# Patient Record
Sex: Female | Born: 1975
Health system: Southern US, Community
[De-identification: ages and names within clinical notes are randomized; demographics above are authoritative.]

## PROBLEM LIST (undated history)

## (undated) DIAGNOSIS — D649 Anemia, unspecified: Secondary | ICD-10-CM

## (undated) DIAGNOSIS — T7840XA Allergy, unspecified, initial encounter: Secondary | ICD-10-CM

## (undated) DIAGNOSIS — E669 Obesity, unspecified: Secondary | ICD-10-CM

## (undated) DIAGNOSIS — M5126 Other intervertebral disc displacement, lumbar region: Secondary | ICD-10-CM

## (undated) DIAGNOSIS — G4730 Sleep apnea, unspecified: Secondary | ICD-10-CM

## (undated) HISTORY — DX: Allergy, unspecified, initial encounter: T78.40XA

## (undated) HISTORY — DX: Other intervertebral disc displacement, lumbar region: M51.26

## (undated) HISTORY — PX: TONSILLECTOMY: SUR1361

## (undated) HISTORY — PX: HERNIA REPAIR: SHX51

---

## 1999-02-24 ENCOUNTER — Other Ambulatory Visit: Admission: RE | Admit: 1999-02-24 | Discharge: 1999-02-24 | Payer: Self-pay | Admitting: Gynecology

## 1999-07-01 ENCOUNTER — Other Ambulatory Visit: Admission: RE | Admit: 1999-07-01 | Discharge: 1999-07-01 | Payer: Self-pay | Admitting: Obstetrics & Gynecology

## 2000-06-29 ENCOUNTER — Other Ambulatory Visit: Admission: RE | Admit: 2000-06-29 | Discharge: 2000-06-29 | Payer: Self-pay | Admitting: Obstetrics & Gynecology

## 2001-07-19 ENCOUNTER — Other Ambulatory Visit: Admission: RE | Admit: 2001-07-19 | Discharge: 2001-07-19 | Payer: Self-pay | Admitting: Obstetrics & Gynecology

## 2002-08-02 ENCOUNTER — Other Ambulatory Visit: Admission: RE | Admit: 2002-08-02 | Discharge: 2002-08-02 | Payer: Self-pay | Admitting: Obstetrics & Gynecology

## 2002-12-14 ENCOUNTER — Emergency Department (HOSPITAL_COMMUNITY): Admission: EM | Admit: 2002-12-14 | Discharge: 2002-12-14 | Payer: Self-pay | Admitting: Emergency Medicine

## 2002-12-14 ENCOUNTER — Encounter: Payer: Self-pay | Admitting: Emergency Medicine

## 2003-08-09 ENCOUNTER — Inpatient Hospital Stay (HOSPITAL_COMMUNITY): Admission: AD | Admit: 2003-08-09 | Discharge: 2003-08-13 | Payer: Self-pay | Admitting: Obstetrics & Gynecology

## 2003-08-14 ENCOUNTER — Encounter: Admission: RE | Admit: 2003-08-14 | Discharge: 2003-09-13 | Payer: Self-pay | Admitting: Obstetrics and Gynecology

## 2003-09-25 ENCOUNTER — Other Ambulatory Visit: Admission: RE | Admit: 2003-09-25 | Discharge: 2003-09-25 | Payer: Self-pay | Admitting: Obstetrics and Gynecology

## 2004-10-01 ENCOUNTER — Other Ambulatory Visit: Admission: RE | Admit: 2004-10-01 | Discharge: 2004-10-01 | Payer: Self-pay | Admitting: Obstetrics & Gynecology

## 2005-10-06 ENCOUNTER — Other Ambulatory Visit: Admission: RE | Admit: 2005-10-06 | Discharge: 2005-10-06 | Payer: Self-pay | Admitting: Obstetrics & Gynecology

## 2008-12-21 HISTORY — PX: WISDOM TOOTH EXTRACTION: SHX21

## 2011-09-17 ENCOUNTER — Other Ambulatory Visit: Payer: Self-pay | Admitting: Physical Medicine and Rehabilitation

## 2011-09-17 DIAGNOSIS — M545 Low back pain, unspecified: Secondary | ICD-10-CM

## 2011-09-18 ENCOUNTER — Ambulatory Visit
Admission: RE | Admit: 2011-09-18 | Discharge: 2011-09-18 | Disposition: A | Discharge status: Home / Self Care | Payer: BC Managed Care – PPO | Source: Ambulatory Visit | Attending: Physical Medicine and Rehabilitation | Admitting: Physical Medicine and Rehabilitation

## 2011-09-18 DIAGNOSIS — M545 Low back pain, unspecified: Secondary | ICD-10-CM

## 2011-09-18 MED ORDER — IOHEXOL 180 MG/ML  SOLN
1.0000 mL | Freq: Once | INTRAMUSCULAR | Status: AC | PRN
  Administered 2011-09-18: 1 mL via EPIDURAL

## 2011-09-18 MED ORDER — METHYLPREDNISOLONE ACETATE 40 MG/ML INJ SUSP (RADIOLOG
120.0000 mg | Freq: Once | INTRAMUSCULAR | Status: AC
  Administered 2011-09-18: 120 mg via EPIDURAL

## 2015-07-16 ENCOUNTER — Other Ambulatory Visit: Payer: Self-pay | Admitting: Specialist

## 2015-07-22 ENCOUNTER — Other Ambulatory Visit: Payer: Self-pay | Admitting: Specialist

## 2015-08-06 ENCOUNTER — Ambulatory Visit
Admission: RE | Admit: 2015-08-06 | Discharge: 2015-08-06 | Disposition: A | Discharge status: Home / Self Care | Payer: BLUE CROSS/BLUE SHIELD | Source: Ambulatory Visit | Attending: Specialist | Admitting: Specialist

## 2015-11-21 HISTORY — PX: BARIATRIC SURGERY: SHX1103

## 2015-12-03 ENCOUNTER — Other Ambulatory Visit: Payer: Self-pay

## 2015-12-03 ENCOUNTER — Encounter
Admission: RE | Admit: 2015-12-03 | Discharge: 2015-12-03 | Disposition: A | Discharge status: Home / Self Care | Payer: BLUE CROSS/BLUE SHIELD | Source: Ambulatory Visit | Attending: Specialist | Admitting: Specialist

## 2015-12-03 DIAGNOSIS — Z01818 Encounter for other preprocedural examination: Secondary | ICD-10-CM | POA: Insufficient documentation

## 2015-12-03 HISTORY — DX: Anemia, unspecified: D64.9

## 2015-12-03 HISTORY — DX: Obesity, unspecified: E66.9

## 2015-12-03 HISTORY — DX: Sleep apnea, unspecified: G47.30

## 2015-12-03 LAB — TYPE AND SCREEN
ABO/RH(D): AB POS
Antibody Screen: NEGATIVE

## 2015-12-03 NOTE — Patient Instructions (Signed)
  Your procedure is scheduled on: December 10, 2015 (Tuesday) Report to Day Surgery. To find out your arrival time please call [phone] between 1PM - 3PM on December 09, 2015 (Monday).  Remember: Instructions that are not followed completely may result in serious medical risk, up to and including death, or upon the discretion of your surgeon and anesthesiologist your surgery may need to be rescheduled.    __x_ 1. Do not eat food or drink liquids after midnight. No gum chewing or hard candies.     __x__ 2. No Alcohol for 24 hours before or after surgery.   ____ 3. Bring all medications with you on the day of surgery if instructed.    __x__ 4. Notify your doctor if there is any change in your medical condition     (cold, fever, infections).     Do not wear jewelry, make-up, hairpins, clips or nail polish.  Do not wear lotions, powders, or perfumes. You may wear deodorant.  Do not shave 48 hours prior to surgery. Men may shave face and neck.  Do not bring valuables to the hospital.    [facility] is not responsible for any belongings or valuables.               Contacts, dentures or bridgework may not be worn into surgery.  Leave your suitcase in the car. After surgery it may be brought to your room.  For patients admitted to the hospital, discharge time is determined by your                treatment team.   Patients discharged the day of surgery will not be allowed to drive home.   Please read over the following fact sheets that you were given:   Surgical Site Infection Prevention   ____ Take these medicines the morning of surgery with A SIP OF WATER:    1.   2.   3.   4.  5.  6.  ____ Fleet Enema (as directed)   __x__ Use CHG Soap as directed  ____ Use inhalers on the day of surgery  ____ Stop metformin 2 days prior to surgery    ____ Take 1/2 of usual insulin dose the night before surgery and none on the morning of surgery.   ____ Stop Coumadin/Plavix/aspirin on    ____ Stop Anti-inflammatories on    ____ Stop supplements until after surgery.    __x__ Bring C-Pap to the hospital.

## 2015-12-04 LAB — ABO/RH: ABO/RH(D): AB POS

## 2015-12-04 NOTE — Pre-Procedure Instructions (Addendum)
Obtained pre-op order sheet and patient chart no orders checked to be done on form.  Office answering service picked up phone unable to help will placed in needs box.

## 2015-12-10 ENCOUNTER — Encounter: Admission: RE | Payer: Self-pay | Source: Ambulatory Visit

## 2015-12-10 ENCOUNTER — Inpatient Hospital Stay: Admission: RE | Admit: 2015-12-10 | Payer: BLUE CROSS/BLUE SHIELD | Source: Ambulatory Visit | Admitting: Specialist

## 2015-12-10 SURGERY — LAPAROSCOPIC GASTRIC RESTRICTIVE DUODENAL PROCEDURE (DUODENAL SWITCH)
Anesthesia: Choice

## 2016-04-29 DIAGNOSIS — K912 Postsurgical malabsorption, not elsewhere classified: Secondary | ICD-10-CM | POA: Diagnosis not present

## 2016-04-29 DIAGNOSIS — Z9884 Bariatric surgery status: Secondary | ICD-10-CM | POA: Diagnosis not present

## 2016-06-02 DIAGNOSIS — K912 Postsurgical malabsorption, not elsewhere classified: Secondary | ICD-10-CM | POA: Diagnosis not present

## 2016-06-02 DIAGNOSIS — Z9884 Bariatric surgery status: Secondary | ICD-10-CM | POA: Diagnosis not present

## 2016-06-24 DIAGNOSIS — M5431 Sciatica, right side: Secondary | ICD-10-CM | POA: Diagnosis not present

## 2016-06-26 ENCOUNTER — Other Ambulatory Visit: Payer: Self-pay | Admitting: Physical Medicine and Rehabilitation

## 2016-06-26 DIAGNOSIS — M5136 Other intervertebral disc degeneration, lumbar region: Secondary | ICD-10-CM

## 2016-06-27 DIAGNOSIS — H5213 Myopia, bilateral: Secondary | ICD-10-CM | POA: Diagnosis not present

## 2016-07-01 ENCOUNTER — Ambulatory Visit
Admission: RE | Admit: 2016-07-01 | Discharge: 2016-07-01 | Disposition: A | Discharge status: Home / Self Care | Payer: BLUE CROSS/BLUE SHIELD | Source: Ambulatory Visit | Attending: Physical Medicine and Rehabilitation | Admitting: Physical Medicine and Rehabilitation

## 2016-07-01 DIAGNOSIS — M5136 Other intervertebral disc degeneration, lumbar region: Secondary | ICD-10-CM

## 2016-07-01 DIAGNOSIS — M47817 Spondylosis without myelopathy or radiculopathy, lumbosacral region: Secondary | ICD-10-CM | POA: Diagnosis not present

## 2016-07-01 MED ORDER — METHYLPREDNISOLONE ACETATE 40 MG/ML INJ SUSP (RADIOLOG
120.0000 mg | Freq: Once | INTRAMUSCULAR | Status: AC
  Administered 2016-07-01: 120 mg via EPIDURAL

## 2016-07-01 MED ORDER — IOPAMIDOL (ISOVUE-M 200) INJECTION 41%
1.0000 mL | Freq: Once | INTRAMUSCULAR | Status: AC
  Administered 2016-07-01: 1 mL via EPIDURAL

## 2016-07-01 NOTE — Discharge Instructions (Signed)

## 2016-08-23 ENCOUNTER — Emergency Department (HOSPITAL_COMMUNITY)
Admission: EM | Admit: 2016-08-23 | Discharge: 2016-08-23 | Disposition: A | Discharge status: Home / Self Care | Payer: BLUE CROSS/BLUE SHIELD | Attending: Emergency Medicine | Admitting: Emergency Medicine

## 2016-08-23 ENCOUNTER — Encounter (HOSPITAL_COMMUNITY): Payer: Self-pay | Admitting: Emergency Medicine

## 2016-08-23 DIAGNOSIS — R200 Anesthesia of skin: Secondary | ICD-10-CM | POA: Insufficient documentation

## 2016-08-23 DIAGNOSIS — Z79899 Other long term (current) drug therapy: Secondary | ICD-10-CM | POA: Insufficient documentation

## 2016-08-23 DIAGNOSIS — M79604 Pain in right leg: Secondary | ICD-10-CM | POA: Diagnosis not present

## 2016-08-23 DIAGNOSIS — R202 Paresthesia of skin: Secondary | ICD-10-CM | POA: Insufficient documentation

## 2016-08-23 LAB — URINALYSIS, ROUTINE W REFLEX MICROSCOPIC
BILIRUBIN URINE: NEGATIVE
GLUCOSE, UA: NEGATIVE mg/dL
Hgb urine dipstick: NEGATIVE
Ketones, ur: 15 mg/dL — AB
Leukocytes, UA: NEGATIVE
NITRITE: NEGATIVE
PH: 7.5 (ref 5.0–8.0)
Protein, ur: NEGATIVE mg/dL
SPECIFIC GRAVITY, URINE: 1.019 (ref 1.005–1.030)

## 2016-08-23 LAB — PREGNANCY, URINE: Preg Test, Ur: NEGATIVE

## 2016-08-23 MED ORDER — KETOROLAC TROMETHAMINE 30 MG/ML IJ SOLN
30.0000 mg | Freq: Once | INTRAMUSCULAR | Status: AC
  Administered 2016-08-23: 30 mg via INTRAMUSCULAR
  Filled 2016-08-23: qty 1

## 2016-08-23 MED ORDER — KETOROLAC TROMETHAMINE 30 MG/ML IJ SOLN: 30.0000 mg | Freq: Once | INTRAMUSCULAR | Status: DC

## 2016-08-23 MED ORDER — OXYCODONE-ACETAMINOPHEN 5-325 MG PO TABS
1.0000 | ORAL_TABLET | Freq: Once | ORAL | Status: AC
  Administered 2016-08-23: 1 via ORAL

## 2016-08-23 MED ORDER — OXYCODONE-ACETAMINOPHEN 5-325 MG PO TABS
1.0000 | ORAL_TABLET | ORAL | Status: DC | PRN
  Administered 2016-08-23: 1 via ORAL
  Filled 2016-08-23 (×2): qty 1

## 2016-08-23 NOTE — Discharge Instructions (Signed)
Continue to take your prescribed medications at home for back pain. Get plenty of rest. Activity as tolerated. Follow up with your orthopaedist on Tuesday. If your leg starts swelling, gets red, becomes extremely painful, or if you develop chest pain or shortness of breath return to the ED.

## 2016-08-23 NOTE — ED Provider Notes (Signed)
[facility]-EMERGENCY DEPT Provider Note   CSN: [id number] Arrival date & time: 08/23/16  1734     History   Chief Complaint Chief Complaint  Patient presents with  . Leg Pain    HPI Alisha Brock is a 40 y.o. female.  40 year old African-American female with history of lumbar herniated disc that presents to the ED this evening with right leg pain. Patient states pain started approximately 2 weeks ago when she went to get out of the bed to go the bathroom. The pain starts at her right lower back and radiates down her entire right leg to her right calf. Describes the pain as an aching that is constant. Endorses right leg numbness and tinling. Denies back pain at this time. Moving makes pain worse. She has tried meloxicam and hydrocodone with little relief. Lying on her left side helps ease the pain. Able to walk. She has been exercising which seems to help the pain acutely. Denies any recent trauma. Patient epidural steroid injection in July 2017. Patient states she is worried about a blood clot. Patient returned from [place] August 16. Leg pain started approximately one week after returning. Patient endorses using oral contraceptives. Denies unilateral leg swelling, recent hospitalizations/surgeries, tobacco use, history of DVT. Patient states that her father has history of blood clots. Denies any fever, chills, vision changes, headache, chest pain, shortness of breath, abdominal pain, nausea, vomiting, loss of bowel or bladder, urinary retention, saddle paresthesia.       Past Medical History:  Diagnosis Date  . Anemia   . Obesity   . Sleep apnea     There are no active problems to display for this patient.   Past Surgical History:  Procedure Laterality Date  . CESAREAN SECTION  August 2004  . HERNIA REPAIR Bilateral    Inguinal Hernia Repair at age 79.  . TONSILLECTOMY      OB History    No data available       Home Medications    Prior to Admission medications     Medication Sig Start Date End Date Taking? Authorizing Provider  BIOTIN PO Take 1 tablet by mouth daily.   Yes Historical Provider, MD  Cholecalciferol (VITAMIN D3) 10000 units capsule Take 10,000 Units by mouth daily.   Yes Historical Provider, MD  [facility] Liver Oil CAPS Take 1 capsule by mouth daily.   Yes Historical Provider, MD  Fe Bisgly-Vit C-Vit B12-FA (GENTLE IRON PO) Take 1 tablet by mouth daily.   Yes Historical Provider, MD  fexofenadine (ALLEGRA) 180 MG tablet Take 180 mg by mouth daily.   Yes Historical Provider, MD  HYDROcodone-acetaminophen (HYCET) 7.5-325 mg/15 ml solution Take 15 mLs by mouth 2 (two) times daily as needed for moderate pain.   Yes Historical Provider, MD  ketotifen (ALAWAY) 0.025 % ophthalmic solution Place 1 drop into both eyes daily.   Yes Historical Provider, MD  levonorgestrel-ethinyl estradiol (SEASONALE,INTROVALE,JOLESSA) 0.15-0.03 MG tablet Take 1 tablet by mouth daily. 07/19/16  Yes Historical Provider, MD  meloxicam (MOBIC) 15 MG tablet Take 15 mg by mouth daily as needed for pain.  06/25/16  Yes Historical Provider, MD  Multiple Vitamin (MULTIVITAMIN) tablet Take 1 tablet by mouth daily.   Yes Historical Provider, MD  nystatin ointment (MYCOSTATIN) Apply 1 application topically daily as needed (rash).  06/03/16  Yes Historical Provider, MD  OVER THE COUNTER MEDICATION Take 1 tablet by mouth 2 (two) times daily. "super cleanse"   Yes Historical Provider, MD  triamcinolone ointment (KENALOG)  0.1 % Apply 1 application topically daily as needed (rash).  06/03/16  Yes Historical Provider, MD    Family History Family History  Problem Relation Age of Onset  . Hypertension Mother   . Hypercholesterolemia Father     Social History Social History  Substance Use Topics  . Smoking status: Never Smoker  . Smokeless tobacco: Never Used  . Alcohol use Yes     Comment: occ     Allergies   Review of patient's allergies indicates no known allergies.   Review of  Systems Review of Systems  Constitutional: Negative for chills and fever.  HENT: Negative for congestion, ear pain and sore throat.   Eyes: Negative for pain and visual disturbance.  Respiratory: Negative for cough and shortness of breath.   Cardiovascular: Negative for chest pain, palpitations and leg swelling.  Gastrointestinal: Negative for abdominal pain, constipation, diarrhea, nausea and vomiting.  Genitourinary: Negative for dysuria, flank pain, frequency, hematuria and urgency.  Musculoskeletal: Negative for arthralgias and back pain.  Skin: Negative for color change and rash.  Neurological: Negative for dizziness, syncope, weakness, light-headedness, numbness and headaches.  All other systems reviewed and are negative.    Physical Exam Updated Vital Signs BP 123/83 (BP Location: Right Arm)   Pulse 82   Temp 99.3 F (37.4 C) (Oral)   Resp 18   SpO2 100%   Physical Exam  Constitutional: She appears well-developed and well-nourished. No distress.  HENT:  Head: Normocephalic and atraumatic.  Mouth/Throat: Oropharynx is clear and moist.  Eyes: Conjunctivae are normal. Pupils are equal, round, and reactive to light. Right eye exhibits no discharge. Left eye exhibits no discharge. No scleral icterus.  Neck: Normal range of motion. Neck supple. No thyromegaly present.  Cardiovascular: Normal rate, regular rhythm, normal heart sounds and intact distal pulses.   Pulmonary/Chest: Effort normal and breath sounds normal. No respiratory distress.  Abdominal: Soft. Bowel sounds are normal. She exhibits no distension and no mass. There is no tenderness. There is no rebound.  Musculoskeletal: Normal range of motion.  Positive straight leg raise of right leg approx 30 degrees, with pain, numbness, and tingling sensation down her right leg. Full ROM of right hip and knee. No calf tenderness. No Edema of right leg. No erythema. DP pulses 2+ bilaterally with intact sensation. Temperature  normal. Cap refill normal. No T- spine and L-spine midline tenderness. No paraspinal tenderness to palpation. Patient is ttp down right buttocks, lateral thigh and lateral calf. Patient able to ambulate.   Lymphadenopathy:    She has no cervical adenopathy.  Neurological: She is alert. She has normal strength. No sensory deficit.  Skin: Skin is warm and dry. Capillary refill takes less than 2 seconds.  Vitals reviewed.    ED Treatments / Results  Labs (all labs ordered are listed, but only abnormal results are displayed) Labs Reviewed  URINALYSIS, ROUTINE W REFLEX MICROSCOPIC (NOT AT [facility]) - Abnormal; Notable for the following:       Result Value   APPearance CLOUDY (*)    Ketones, ur 15 (*)    All other components within normal limits  PREGNANCY, URINE    EKG  EKG Interpretation None       Radiology No results found.  Procedures Procedures (including critical care time)  Medications Ordered in ED Medications  oxyCODONE-acetaminophen (PERCOCET/ROXICET) 5-325 MG per tablet 1 tablet (1 tablet Oral Given 08/23/16 1817)     Initial Impression / Assessment and Plan / ED Course  I  have reviewed the triage vital signs and the nursing notes.  Pertinent labs & imaging results that were available during my care of the patient were reviewed by me and considered in my medical decision making (see chart for details).  Clinical Course  Patient presents to the ED this evening with right leg pain. Patient has history of herniated lumbar disc. Last epidural injection was July 2017. Patient is tender to palpation right buttocks, down lateral thigh, lateral calf. No swelling or erythema appreciated right leg. Straight leg raise test is positive the right side with numbness and tingling. Pain is likely associated with worsening herniated disc. Pain seems to be sciatic in nature. Low suspicion for DVT. Wells 0. Patient is able to ambulate. Pain medicine and tordal given in ED. Creatine in  December 2016 was normal. Negative urine pregnancy test. UA unremarkable. No neurological deficits and normal neuro exam.  Patient can walk but states is painful.  No loss of bowel or bladder control.  No concern for cauda equina. No recent trauma. Imaging not recommended. No fever, night sweats, weight loss, h/o cancer, IVDU.  RICE protocol and pain medicine indicated and discussed with patient. Pain managed in ED. Strict return precautions given. Patient verbalized understanding the plan of care. Patient encouraged to follow-up with her orthopedist. Discussed plan of care with Dr. Wynona Meals who agrees. Hemodynamically stable. Discharged home in no acute distress with stable vital signs.  Final Clinical Impressions(s) / ED Diagnoses   Final diagnoses:  Right leg pain    New Prescriptions New Prescriptions   No medications on file     Rise Mu, PA-C 08/24/16 0123    Nira Conn, MD 08/24/16 [phone]

## 2016-08-23 NOTE — ED Triage Notes (Signed)
Pt c/o right sided leg pain 10/10 that started about a week ago that has gotten so severe she has to lay on her left side. Pt states she has a hx of a herniated disk but no back pain. Pt adds she was on an airplane in mid August and is concerned about blood clot. Leg does not appear swollen and is same temp/color as left leg.

## 2016-08-27 DIAGNOSIS — M5136 Other intervertebral disc degeneration, lumbar region: Secondary | ICD-10-CM | POA: Diagnosis not present

## 2016-08-28 ENCOUNTER — Other Ambulatory Visit: Payer: Self-pay | Admitting: Physical Medicine and Rehabilitation

## 2016-08-28 DIAGNOSIS — M5136 Other intervertebral disc degeneration, lumbar region: Secondary | ICD-10-CM

## 2016-09-01 ENCOUNTER — Ambulatory Visit
Admission: RE | Admit: 2016-09-01 | Discharge: 2016-09-01 | Disposition: A | Discharge status: Home / Self Care | Payer: BLUE CROSS/BLUE SHIELD | Source: Ambulatory Visit | Attending: Physical Medicine and Rehabilitation | Admitting: Physical Medicine and Rehabilitation

## 2016-09-01 DIAGNOSIS — M5126 Other intervertebral disc displacement, lumbar region: Secondary | ICD-10-CM | POA: Diagnosis not present

## 2016-09-01 DIAGNOSIS — M5136 Other intervertebral disc degeneration, lumbar region: Secondary | ICD-10-CM

## 2016-09-01 MED ORDER — IOPAMIDOL (ISOVUE-M 200) INJECTION 41%
1.0000 mL | Freq: Once | INTRAMUSCULAR | Status: AC
  Administered 2016-09-01: 1 mL via EPIDURAL

## 2016-09-01 MED ORDER — METHYLPREDNISOLONE ACETATE 40 MG/ML INJ SUSP (RADIOLOG
120.0000 mg | Freq: Once | INTRAMUSCULAR | Status: AC
  Administered 2016-09-01: 120 mg via EPIDURAL

## 2016-09-11 DIAGNOSIS — M5136 Other intervertebral disc degeneration, lumbar region: Secondary | ICD-10-CM | POA: Diagnosis not present

## 2016-09-24 DIAGNOSIS — M5442 Lumbago with sciatica, left side: Secondary | ICD-10-CM | POA: Diagnosis not present

## 2016-09-24 DIAGNOSIS — M5136 Other intervertebral disc degeneration, lumbar region: Secondary | ICD-10-CM | POA: Diagnosis not present

## 2016-09-24 DIAGNOSIS — M5431 Sciatica, right side: Secondary | ICD-10-CM | POA: Diagnosis not present

## 2016-09-28 DIAGNOSIS — M5126 Other intervertebral disc displacement, lumbar region: Secondary | ICD-10-CM | POA: Diagnosis not present

## 2016-09-28 DIAGNOSIS — M5416 Radiculopathy, lumbar region: Secondary | ICD-10-CM | POA: Diagnosis not present

## 2016-10-04 DIAGNOSIS — J309 Allergic rhinitis, unspecified: Secondary | ICD-10-CM | POA: Diagnosis not present

## 2016-10-04 DIAGNOSIS — J0100 Acute maxillary sinusitis, unspecified: Secondary | ICD-10-CM | POA: Diagnosis not present

## 2016-10-09 DIAGNOSIS — M5431 Sciatica, right side: Secondary | ICD-10-CM | POA: Diagnosis not present

## 2016-11-03 DIAGNOSIS — M5136 Other intervertebral disc degeneration, lumbar region: Secondary | ICD-10-CM | POA: Diagnosis not present

## 2016-11-03 DIAGNOSIS — M5442 Lumbago with sciatica, left side: Secondary | ICD-10-CM | POA: Diagnosis not present

## 2016-11-03 DIAGNOSIS — G8929 Other chronic pain: Secondary | ICD-10-CM | POA: Diagnosis not present

## 2016-11-03 DIAGNOSIS — M5431 Sciatica, right side: Secondary | ICD-10-CM | POA: Diagnosis not present

## 2016-11-10 DIAGNOSIS — M5431 Sciatica, right side: Secondary | ICD-10-CM | POA: Diagnosis not present

## 2016-11-17 DIAGNOSIS — M5442 Lumbago with sciatica, left side: Secondary | ICD-10-CM | POA: Diagnosis not present

## 2016-11-17 DIAGNOSIS — G8929 Other chronic pain: Secondary | ICD-10-CM | POA: Diagnosis not present

## 2016-11-17 DIAGNOSIS — M5431 Sciatica, right side: Secondary | ICD-10-CM | POA: Diagnosis not present

## 2016-12-01 DIAGNOSIS — R638 Other symptoms and signs concerning food and fluid intake: Secondary | ICD-10-CM | POA: Diagnosis not present

## 2016-12-01 DIAGNOSIS — Z5181 Encounter for therapeutic drug level monitoring: Secondary | ICD-10-CM | POA: Diagnosis not present

## 2016-12-01 DIAGNOSIS — K912 Postsurgical malabsorption, not elsewhere classified: Secondary | ICD-10-CM | POA: Diagnosis not present

## 2016-12-01 DIAGNOSIS — Z9884 Bariatric surgery status: Secondary | ICD-10-CM | POA: Diagnosis not present

## 2016-12-18 DIAGNOSIS — G4733 Obstructive sleep apnea (adult) (pediatric): Secondary | ICD-10-CM | POA: Diagnosis not present

## 2016-12-18 DIAGNOSIS — R0683 Snoring: Secondary | ICD-10-CM | POA: Diagnosis not present

## 2017-01-12 DIAGNOSIS — K912 Postsurgical malabsorption, not elsewhere classified: Secondary | ICD-10-CM | POA: Diagnosis not present

## 2017-01-12 DIAGNOSIS — Z9884 Bariatric surgery status: Secondary | ICD-10-CM | POA: Diagnosis not present

## 2017-02-09 DIAGNOSIS — Z9884 Bariatric surgery status: Secondary | ICD-10-CM | POA: Diagnosis not present

## 2017-02-09 DIAGNOSIS — K912 Postsurgical malabsorption, not elsewhere classified: Secondary | ICD-10-CM | POA: Diagnosis not present

## 2017-03-09 DIAGNOSIS — Z9884 Bariatric surgery status: Secondary | ICD-10-CM | POA: Diagnosis not present

## 2017-03-09 DIAGNOSIS — K912 Postsurgical malabsorption, not elsewhere classified: Secondary | ICD-10-CM | POA: Diagnosis not present

## 2017-05-25 DIAGNOSIS — K912 Postsurgical malabsorption, not elsewhere classified: Secondary | ICD-10-CM | POA: Diagnosis not present

## 2017-05-25 DIAGNOSIS — Z9884 Bariatric surgery status: Secondary | ICD-10-CM | POA: Diagnosis not present

## 2017-06-12 DIAGNOSIS — H5213 Myopia, bilateral: Secondary | ICD-10-CM | POA: Diagnosis not present

## 2017-06-24 DIAGNOSIS — L718 Other rosacea: Secondary | ICD-10-CM | POA: Diagnosis not present

## 2017-08-10 DIAGNOSIS — Z9884 Bariatric surgery status: Secondary | ICD-10-CM | POA: Diagnosis not present

## 2017-09-14 DIAGNOSIS — L821 Other seborrheic keratosis: Secondary | ICD-10-CM | POA: Diagnosis not present

## 2017-09-14 DIAGNOSIS — L918 Other hypertrophic disorders of the skin: Secondary | ICD-10-CM | POA: Diagnosis not present

## 2017-09-14 DIAGNOSIS — L719 Rosacea, unspecified: Secondary | ICD-10-CM | POA: Diagnosis not present

## 2017-12-29 DIAGNOSIS — Z9884 Bariatric surgery status: Secondary | ICD-10-CM | POA: Diagnosis not present

## 2018-02-09 DIAGNOSIS — Z6834 Body mass index (BMI) 34.0-34.9, adult: Secondary | ICD-10-CM | POA: Diagnosis not present

## 2018-02-09 DIAGNOSIS — Z01419 Encounter for gynecological examination (general) (routine) without abnormal findings: Secondary | ICD-10-CM | POA: Diagnosis not present

## 2018-02-09 DIAGNOSIS — Z1231 Encounter for screening mammogram for malignant neoplasm of breast: Secondary | ICD-10-CM | POA: Diagnosis not present

## 2018-03-02 DIAGNOSIS — M519 Unspecified thoracic, thoracolumbar and lumbosacral intervertebral disc disorder: Secondary | ICD-10-CM | POA: Diagnosis not present

## 2018-03-02 DIAGNOSIS — M545 Low back pain: Secondary | ICD-10-CM | POA: Diagnosis not present

## 2018-03-08 ENCOUNTER — Other Ambulatory Visit: Payer: Self-pay | Admitting: Specialist

## 2018-03-08 DIAGNOSIS — M519 Unspecified thoracic, thoracolumbar and lumbosacral intervertebral disc disorder: Secondary | ICD-10-CM

## 2018-03-15 ENCOUNTER — Ambulatory Visit
Admission: RE | Admit: 2018-03-15 | Discharge: 2018-03-15 | Disposition: A | Discharge status: Home / Self Care | Payer: BLUE CROSS/BLUE SHIELD | Source: Ambulatory Visit | Attending: Specialist | Admitting: Specialist

## 2018-03-15 DIAGNOSIS — M519 Unspecified thoracic, thoracolumbar and lumbosacral intervertebral disc disorder: Secondary | ICD-10-CM

## 2018-03-15 DIAGNOSIS — M5126 Other intervertebral disc displacement, lumbar region: Secondary | ICD-10-CM | POA: Diagnosis not present

## 2018-03-15 MED ORDER — METHYLPREDNISOLONE ACETATE 40 MG/ML INJ SUSP (RADIOLOG
120.0000 mg | Freq: Once | INTRAMUSCULAR | Status: AC
  Administered 2018-03-15: 120 mg via EPIDURAL

## 2018-03-15 MED ORDER — IOPAMIDOL (ISOVUE-M 200) INJECTION 41%
1.0000 mL | Freq: Once | INTRAMUSCULAR | Status: AC
  Administered 2018-03-15: 1 mL via EPIDURAL

## 2018-03-15 NOTE — Discharge Instructions (Signed)

## 2018-03-26 DIAGNOSIS — J0100 Acute maxillary sinusitis, unspecified: Secondary | ICD-10-CM | POA: Diagnosis not present

## 2018-04-21 DIAGNOSIS — J060 Acute laryngopharyngitis: Secondary | ICD-10-CM | POA: Diagnosis not present

## 2018-04-21 DIAGNOSIS — Z6835 Body mass index (BMI) 35.0-35.9, adult: Secondary | ICD-10-CM | POA: Diagnosis not present

## 2018-05-05 DIAGNOSIS — H0100A Unspecified blepharitis right eye, upper and lower eyelids: Secondary | ICD-10-CM | POA: Diagnosis not present

## 2018-05-05 DIAGNOSIS — H0100B Unspecified blepharitis left eye, upper and lower eyelids: Secondary | ICD-10-CM | POA: Diagnosis not present

## 2018-05-05 DIAGNOSIS — L233 Allergic contact dermatitis due to drugs in contact with skin: Secondary | ICD-10-CM | POA: Diagnosis not present

## 2018-05-05 DIAGNOSIS — H04123 Dry eye syndrome of bilateral lacrimal glands: Secondary | ICD-10-CM | POA: Diagnosis not present

## 2018-07-28 DIAGNOSIS — Z3480 Encounter for supervision of other normal pregnancy, unspecified trimester: Secondary | ICD-10-CM | POA: Diagnosis not present

## 2018-07-28 DIAGNOSIS — Z6837 Body mass index (BMI) 37.0-37.9, adult: Secondary | ICD-10-CM | POA: Diagnosis not present

## 2018-07-28 DIAGNOSIS — Z3490 Encounter for supervision of normal pregnancy, unspecified, unspecified trimester: Secondary | ICD-10-CM | POA: Insufficient documentation

## 2018-07-28 DIAGNOSIS — Z3201 Encounter for pregnancy test, result positive: Secondary | ICD-10-CM | POA: Diagnosis not present

## 2018-07-28 DIAGNOSIS — N925 Other specified irregular menstruation: Secondary | ICD-10-CM | POA: Diagnosis not present

## 2018-08-11 ENCOUNTER — Other Ambulatory Visit: Payer: Self-pay | Admitting: Obstetrics and Gynecology

## 2018-08-11 DIAGNOSIS — O3680X9 Pregnancy with inconclusive fetal viability, other fetus: Secondary | ICD-10-CM | POA: Diagnosis not present

## 2018-08-11 NOTE — H&P (Signed)
42 y.o.G2P1 with MAB at about 7-8 weeks.  Pt desires definitive.    Past Medical History:  Diagnosis Date  . Anemia   . Obesity   . Sleep apnea    Past Surgical History:  Procedure Laterality Date  . CESAREAN SECTION  August 2004  . HERNIA REPAIR Bilateral    Inguinal Hernia Repair at age 51.  . TONSILLECTOMY      Social History   Socioeconomic History  . Marital status: Married    Spouse name: Not on file  . Number of children: Not on file  . Years of education: Not on file  . Highest education level: Not on file  Occupational History  . Not on file  Social Needs  . Financial resource strain: Not on file  . Food insecurity:    Worry: Not on file    Inability: Not on file  . Transportation needs:    Medical: Not on file    Non-medical: Not on file  Tobacco Use  . Smoking status: Never Smoker  . Smokeless tobacco: Never Used  Substance and Sexual Activity  . Alcohol use: Yes    Comment: occ  . Drug use: No  . Sexual activity: Not on file  Lifestyle  . Physical activity:    Days per week: Not on file    Minutes per session: Not on file  . Stress: Not on file  Relationships  . Social connections:    Talks on phone: Not on file    Gets together: Not on file    Attends religious service: Not on file    Active member of club or organization: Not on file    Attends meetings of clubs or organizations: Not on file    Relationship status: Not on file  . Intimate partner violence:    Fear of current or ex partner: Not on file    Emotionally abused: Not on file    Physically abused: Not on file    Forced sexual activity: Not on file  Other Topics Concern  . Not on file  Social History Narrative  . Not on file    No current facility-administered medications on file prior to encounter.    Current Outpatient Medications on File Prior to Encounter  Medication Sig Dispense Refill  . BIOTIN PO Take 1 tablet by mouth daily.    . Cholecalciferol (VITAMIN D3) 10000  units capsule Take 10,000 Units by mouth daily.    Marland Kitchen Cod Liver Oil CAPS Take 1 capsule by mouth daily.    Marland Kitchen Fe Bisgly-Vit C-Vit B12-FA (GENTLE IRON PO) Take 1 tablet by mouth daily.    . fexofenadine (ALLEGRA) 180 MG tablet Take 180 mg by mouth daily.    Marland Kitchen HYDROcodone-acetaminophen (HYCET) 7.5-325 mg/15 ml solution Take 15 mLs by mouth 2 (two) times daily as needed for moderate pain.    Marland Kitchen ketotifen (ALAWAY) 0.025 % ophthalmic solution Place 1 drop into both eyes daily.    Marland Kitchen levonorgestrel-ethinyl estradiol (SEASONALE,INTROVALE,JOLESSA) 0.15-0.03 MG tablet Take 1 tablet by mouth daily.  3  . meloxicam (MOBIC) 15 MG tablet Take 15 mg by mouth daily as needed for pain.   0  . Multiple Vitamin (MULTIVITAMIN) tablet Take 1 tablet by mouth daily.    Marland Kitchen nystatin ointment (MYCOSTATIN) Apply 1 application topically daily as needed (rash).   2  . OVER THE COUNTER MEDICATION Take 1 tablet by mouth 2 (two) times daily. "super cleanse"    . triamcinolone ointment (KENALOG)  0.1 % Apply 1 application topically daily as needed (rash).   2    No Known Allergies  There were no vitals filed for this visit.  Lungs: clear to ascultation Cor:  RRR Abdomen:  soft, nontender, nondistended. Ex:  no cords, erythema Pelvic:  Normal s/s uterus and no masses  A:  7-8 week MAB for D&E.  Will need blood type today.   P: P: All risks, benefits and alternatives d/w patient and she desires to proceed.  SCDS and check type and screen. Gwynneth Fabio A

## 2018-08-12 ENCOUNTER — Ambulatory Visit (HOSPITAL_COMMUNITY): Payer: BLUE CROSS/BLUE SHIELD | Admitting: Anesthesiology

## 2018-08-12 ENCOUNTER — Encounter (HOSPITAL_COMMUNITY): Payer: Self-pay | Admitting: Anesthesiology

## 2018-08-12 ENCOUNTER — Encounter (HOSPITAL_COMMUNITY)
Admission: RE | Disposition: A | Discharge status: Home / Self Care | Payer: Self-pay | Source: Ambulatory Visit | Attending: Obstetrics and Gynecology

## 2018-08-12 ENCOUNTER — Ambulatory Visit (HOSPITAL_COMMUNITY)
Admission: RE | Admit: 2018-08-12 | Discharge: 2018-08-12 | Disposition: A | Discharge status: Home / Self Care | Payer: BLUE CROSS/BLUE SHIELD | Source: Ambulatory Visit | Attending: Obstetrics and Gynecology | Admitting: Obstetrics and Gynecology

## 2018-08-12 ENCOUNTER — Other Ambulatory Visit: Payer: Self-pay

## 2018-08-12 DIAGNOSIS — G4730 Sleep apnea, unspecified: Secondary | ICD-10-CM | POA: Insufficient documentation

## 2018-08-12 DIAGNOSIS — E669 Obesity, unspecified: Secondary | ICD-10-CM | POA: Insufficient documentation

## 2018-08-12 DIAGNOSIS — O021 Missed abortion: Secondary | ICD-10-CM | POA: Diagnosis not present

## 2018-08-12 DIAGNOSIS — Z6837 Body mass index (BMI) 37.0-37.9, adult: Secondary | ICD-10-CM | POA: Insufficient documentation

## 2018-08-12 HISTORY — PX: DILATION AND EVACUATION: SHX1459

## 2018-08-12 LAB — TYPE AND SCREEN
ABO/RH(D): AB POS
ANTIBODY SCREEN: NEGATIVE

## 2018-08-12 LAB — CBC
HCT: 41 % (ref 36.0–46.0)
HEMOGLOBIN: 13.8 g/dL (ref 12.0–15.0)
MCH: 27.8 pg (ref 26.0–34.0)
MCHC: 33.7 g/dL (ref 30.0–36.0)
MCV: 82.5 fL (ref 78.0–100.0)
PLATELETS: 350 10*3/uL (ref 150–400)
RBC: 4.97 MIL/uL (ref 3.87–5.11)
RDW: 13.8 % (ref 11.5–15.5)
WBC: 7.7 10*3/uL (ref 4.0–10.5)

## 2018-08-12 LAB — ABO/RH: ABO/RH(D): AB POS

## 2018-08-12 SURGERY — DILATION AND EVACUATION, UTERUS
Anesthesia: General | Site: Vagina

## 2018-08-12 MED ORDER — FENTANYL CITRATE (PF) 250 MCG/5ML IJ SOLN
INTRAMUSCULAR | Status: AC
  Filled 2018-08-12: qty 5

## 2018-08-12 MED ORDER — SOD CITRATE-CITRIC ACID 500-334 MG/5ML PO SOLN
30.0000 mL | ORAL | Status: AC
  Administered 2018-08-12: 30 mL via ORAL

## 2018-08-12 MED ORDER — LIDOCAINE HCL (CARDIAC) PF 100 MG/5ML IV SOSY
PREFILLED_SYRINGE | INTRAVENOUS | Status: AC
  Filled 2018-08-12: qty 5

## 2018-08-12 MED ORDER — ONDANSETRON HCL 4 MG/2ML IJ SOLN: 4.0000 mg | Freq: Once | INTRAMUSCULAR | Status: DC | PRN

## 2018-08-12 MED ORDER — MIDAZOLAM HCL 2 MG/2ML IJ SOLN
INTRAMUSCULAR | Status: AC
  Filled 2018-08-12: qty 2

## 2018-08-12 MED ORDER — IBUPROFEN 100 MG/5ML PO SUSP
200.0000 mg | Freq: Four times a day (QID) | ORAL | Status: DC | PRN
  Filled 2018-08-12: qty 20

## 2018-08-12 MED ORDER — SOD CITRATE-CITRIC ACID 500-334 MG/5ML PO SOLN
ORAL | Status: AC
  Administered 2018-08-12: 30 mL via ORAL
  Filled 2018-08-12: qty 15

## 2018-08-12 MED ORDER — KETOROLAC TROMETHAMINE 30 MG/ML IJ SOLN
INTRAMUSCULAR | Status: DC | PRN
  Administered 2018-08-12: 30 mg via INTRAVENOUS

## 2018-08-12 MED ORDER — LIDOCAINE HCL (CARDIAC) PF 100 MG/5ML IV SOSY
PREFILLED_SYRINGE | INTRAVENOUS | Status: DC | PRN
  Administered 2018-08-12: 100 mg via INTRAVENOUS

## 2018-08-12 MED ORDER — KETOROLAC TROMETHAMINE 30 MG/ML IJ SOLN: 30.0000 mg | Freq: Once | INTRAMUSCULAR | Status: DC | PRN

## 2018-08-12 MED ORDER — OXYCODONE HCL 5 MG PO TABS: 5.0000 mg | ORAL_TABLET | Freq: Once | ORAL | Status: DC | PRN

## 2018-08-12 MED ORDER — SCOPOLAMINE 1 MG/3DAYS TD PT72
MEDICATED_PATCH | TRANSDERMAL | Status: AC
  Administered 2018-08-12: 1.5 mg via TRANSDERMAL
  Filled 2018-08-12: qty 1

## 2018-08-12 MED ORDER — SCOPOLAMINE 1 MG/3DAYS TD PT72
1.0000 | MEDICATED_PATCH | Freq: Once | TRANSDERMAL | Status: DC
  Administered 2018-08-12: 1.5 mg via TRANSDERMAL

## 2018-08-12 MED ORDER — MIDAZOLAM HCL 2 MG/2ML IJ SOLN
INTRAMUSCULAR | Status: DC | PRN
  Administered 2018-08-12: 2 mg via INTRAVENOUS

## 2018-08-12 MED ORDER — KETOROLAC TROMETHAMINE 30 MG/ML IJ SOLN
INTRAMUSCULAR | Status: AC
  Filled 2018-08-12: qty 1

## 2018-08-12 MED ORDER — ONDANSETRON HCL 4 MG/2ML IJ SOLN
INTRAMUSCULAR | Status: AC
  Filled 2018-08-12: qty 2

## 2018-08-12 MED ORDER — DEXAMETHASONE SODIUM PHOSPHATE 4 MG/ML IJ SOLN
INTRAMUSCULAR | Status: AC
  Filled 2018-08-12: qty 1

## 2018-08-12 MED ORDER — METHYLERGONOVINE MALEATE 0.2 MG/ML IJ SOLN
INTRAMUSCULAR | Status: AC
  Filled 2018-08-12: qty 1

## 2018-08-12 MED ORDER — FENTANYL CITRATE (PF) 100 MCG/2ML IJ SOLN: 25.0000 ug | INTRAMUSCULAR | Status: DC | PRN

## 2018-08-12 MED ORDER — DEXAMETHASONE SODIUM PHOSPHATE 4 MG/ML IJ SOLN
INTRAMUSCULAR | Status: DC | PRN
  Administered 2018-08-12: 8 mg via INTRAVENOUS

## 2018-08-12 MED ORDER — PROPOFOL 10 MG/ML IV BOLUS
INTRAVENOUS | Status: DC | PRN
  Administered 2018-08-12: 200 mg via INTRAVENOUS

## 2018-08-12 MED ORDER — ONDANSETRON HCL 4 MG/2ML IJ SOLN
INTRAMUSCULAR | Status: DC | PRN
  Administered 2018-08-12: 4 mg via INTRAVENOUS

## 2018-08-12 MED ORDER — MEPERIDINE HCL 25 MG/ML IJ SOLN: 6.2500 mg | INTRAMUSCULAR | Status: DC | PRN

## 2018-08-12 MED ORDER — LACTATED RINGERS IV SOLN
INTRAVENOUS | Status: DC
  Administered 2018-08-12: 1000 mL via INTRAVENOUS
  Administered 2018-08-12: 125 mL/h via INTRAVENOUS

## 2018-08-12 MED ORDER — IBUPROFEN 200 MG PO TABS
200.0000 mg | ORAL_TABLET | Freq: Four times a day (QID) | ORAL | Status: DC | PRN
  Filled 2018-08-12: qty 2

## 2018-08-12 MED ORDER — METHYLERGONOVINE MALEATE 0.2 MG/ML IJ SOLN
INTRAMUSCULAR | Status: DC | PRN
  Administered 2018-08-12: 0.2 mg via INTRAMUSCULAR

## 2018-08-12 MED ORDER — PROPOFOL 10 MG/ML IV BOLUS
INTRAVENOUS | Status: AC
  Filled 2018-08-12: qty 20

## 2018-08-12 MED ORDER — FENTANYL CITRATE (PF) 100 MCG/2ML IJ SOLN
INTRAMUSCULAR | Status: DC | PRN
  Administered 2018-08-12 (×4): 50 ug via INTRAVENOUS

## 2018-08-12 MED ORDER — OXYCODONE HCL 5 MG/5ML PO SOLN: 5.0000 mg | Freq: Once | ORAL | Status: DC | PRN

## 2018-08-12 SURGICAL SUPPLY — 17 items
CATH ROBINSON RED A/P 16FR (CATHETERS) ×2 IMPLANT
DECANTER SPIKE VIAL GLASS SM (MISCELLANEOUS) ×2 IMPLANT
GLOVE BIO SURGEON STRL SZ7 (GLOVE) ×2 IMPLANT
GLOVE BIOGEL PI IND STRL 7.0 (GLOVE) ×1 IMPLANT
GLOVE BIOGEL PI INDICATOR 7.0 (GLOVE) ×1
GOWN STRL REUS W/TWL LRG LVL3 (GOWN DISPOSABLE) ×4 IMPLANT
KIT BERKELEY 1ST TRIMESTER 3/8 (MISCELLANEOUS) ×2 IMPLANT
NS IRRIG 1000ML POUR BTL (IV SOLUTION) ×2 IMPLANT
PACK VAGINAL MINOR WOMEN LF (CUSTOM PROCEDURE TRAY) ×2 IMPLANT
PAD OB MATERNITY 4.3X12.25 (PERSONAL CARE ITEMS) ×2 IMPLANT
PAD PREP 24X48 CUFFED NSTRL (MISCELLANEOUS) ×2 IMPLANT
SET BERKELEY SUCTION TUBING (SUCTIONS) ×2 IMPLANT
TOWEL OR 17X24 6PK STRL BLUE (TOWEL DISPOSABLE) ×4 IMPLANT
VACURETTE 10 RIGID CVD (CANNULA) IMPLANT
VACURETTE 7MM CVD STRL WRAP (CANNULA) IMPLANT
VACURETTE 8 RIGID CVD (CANNULA) IMPLANT
VACURETTE 9 RIGID CVD (CANNULA) ×1 IMPLANT

## 2018-08-12 NOTE — Anesthesia Procedure Notes (Signed)
Procedure Name: LMA Insertion Date/Time: 08/12/2018 12:13 PM Performed by: Earmon PhoenixWilkerson, Lc Joynt P, CRNA Pre-anesthesia Checklist: Patient identified, Emergency Drugs available, Suction available, Patient being monitored and Timeout performed Patient Re-evaluated:Patient Re-evaluated prior to induction Oxygen Delivery Method: Circle system utilized Preoxygenation: Pre-oxygenation with 100% oxygen Induction Type: IV induction Ventilation: Mask ventilation without difficulty LMA: LMA inserted LMA Size: 4.0 Number of attempts: 1 Placement Confirmation: positive ETCO2,  CO2 detector and breath sounds checked- equal and bilateral Tube secured with: Tape Dental Injury: Teeth and Oropharynx as per pre-operative assessment

## 2018-08-12 NOTE — Anesthesia Postprocedure Evaluation (Signed)
Anesthesia Post Note  Patient: Alisha DoffingElecia Brock  Procedure(s) Performed: DILATATION AND EVACUATION (N/A Vagina )     Patient location during evaluation: PACU Anesthesia Type: General Level of consciousness: awake Pain management: pain level controlled Vital Signs Assessment: post-procedure vital signs reviewed and stable Respiratory status: spontaneous breathing Cardiovascular status: stable Postop Assessment: no apparent nausea or vomiting Anesthetic complications: no    Last Vitals:  Vitals:   08/12/18 1315 08/12/18 1345  BP: 125/71 127/76  Pulse: 75 87  Resp: 15 16  Temp:  (!) 36.3 C  SpO2: 99% 100%    Last Pain:  Vitals:   08/12/18 1345  TempSrc:   PainSc: 3    Pain Goal: Patients Stated Pain Goal: 4 (08/12/18 1345)               Lenya Sterne JR,JOHN Susann GivensFRANKLIN

## 2018-08-12 NOTE — Progress Notes (Signed)
There has been no change in the patients history, status or exam since the history and physical.  Vitals:   08/12/18 1022  BP: 136/83  Pulse: 88  Resp: 16  Temp: 98.9 F (37.2 C)  TempSrc: Oral  SpO2: 100%  Weight: 95.7 kg  Height: 5\' 3"  (1.6 m)    Results for orders placed or performed during the hospital encounter of 08/12/18 (from the past 72 hour(s))  CBC     Status: None   Collection Time: 08/12/18 10:08 AM  Result Value Ref Range   WBC 7.7 4.0 - 10.5 K/uL   RBC 4.97 3.87 - 5.11 MIL/uL   Hemoglobin 13.8 12.0 - 15.0 g/dL   HCT 40.941.0 81.136.0 - 91.446.0 %   MCV 82.5 78.0 - 100.0 fL   MCH 27.8 26.0 - 34.0 pg   MCHC 33.7 30.0 - 36.0 g/dL   RDW 78.213.8 95.611.5 - 21.315.5 %   Platelets 350 150 - 400 K/uL    Comment: Performed at [facility], [address] [place], [place] [address]  Type and screen     Status: None   Collection Time: 08/12/18 10:08 AM  Result Value Ref Range   ABO/RH(D) AB POS    Antibody Screen NEG    Sample Expiration      08/15/2018 Performed at [facility], [address] [place], [place] [address]     Yuliza Cara A

## 2018-08-12 NOTE — Transfer of Care (Signed)
Immediate Anesthesia Transfer of Care Note  Patient: Alisha DoffingElecia Brock  Procedure(s) Performed: DILATATION AND EVACUATION (N/A Vagina )  Patient Location: PACU  Anesthesia Type:General  Level of Consciousness: awake and patient cooperative  Airway & Oxygen Therapy: Patient Spontanous Breathing and Patient connected to nasal cannula oxygen  Post-op Assessment: Report given to RN and Post -op Vital signs reviewed and stable  Post vital signs: Reviewed and stable  Last Vitals:  Vitals Value Taken Time  BP 136/79 08/12/2018 12:38 PM  Temp    Pulse 102 08/12/2018 12:40 PM  Resp 20 08/12/2018 12:40 PM  SpO2 100 % 08/12/2018 12:40 PM  Vitals shown include unvalidated device data.  Last Pain:  Vitals:   08/12/18 1022  TempSrc: Oral  PainSc: 0-No pain      Patients Stated Pain Goal: 4 (08/12/18 1022)  Complications: No apparent anesthesia complications

## 2018-08-12 NOTE — Brief Op Note (Signed)
08/12/2018  12:24 PM  PATIENT:  Alisha DoffingElecia Circle  42 y.o. female  PRE-OPERATIVE DIAGNOSIS:  7 week MAB  POST-OPERATIVE DIAGNOSIS:  7 week MAB  PROCEDURE:  Procedure(s): DILATATION AND EVACUATION (N/A)  SURGEON:  Surgeon(s) and Role:    * Carrington ClampHorvath, Shatonia Hoots, MD - Primary  PHYSICIAN ASSISTANT:    ANESTHESIA:   general  EBL:  min    SPECIMEN:  Source of Specimen:  uterine curettings  DISPOSITION OF SPECIMEN:  PATHOLOGY  COUNTS:  YES  TOURNIQUET:  * No tourniquets in log *  DICTATION: .Note written in EPIC  PLAN OF CARE: Discharge to home after PACU  PATIENT DISPOSITION:  PACU - hemodynamically stable.   Delay start of Pharmacological VTE agent (>24hrs) due to surgical blood loss or risk of bleeding: not applicable

## 2018-08-12 NOTE — Discharge Instructions (Addendum)
DISCHARGE INSTRUCTIONS: D&C / D&E The following instructions have been prepared to help you care for yourself upon your return home.   Personal hygiene:  Use sanitary pads for vaginal drainage, not tampons.  Shower the day after your procedure.  NO tub baths, pools or Jacuzzis for 2-3 weeks.  Wipe front to back after using the bathroom.  Activity and limitations:  Do NOT drive or operate any equipment for 24 hours. The effects of anesthesia are still present and drowsiness may result.  Do NOT rest in bed all day.  Walking is encouraged.  Walk up and down stairs slowly.  You may resume your normal activity in one to two days or as indicated by your physician.  Sexual activity: NO intercourse for at least 2 weeks after the procedure, or as indicated by your physician.  Diet: Eat a light meal as desired this evening. You may resume your usual diet tomorrow.  Return to work: You may resume your work activities in one to two days or as indicated by your doctor.  What to expect after your surgery: Expect to have vaginal bleeding/discharge for 2-3 days and spotting for up to 10 days. It is not unusual to have soreness for up to 1-2 weeks. You may have a slight burning sensation when you urinate for the first day. Mild cramps may continue for a couple of days. You may have a regular period in 2-6 weeks.  Call your doctor for any of the following:  Excessive vaginal bleeding, saturating and changing one pad every hour.  Inability to urinate 6 hours after discharge from hospital.  Pain not relieved by pain medication.  Fever of 100.4 F or greater.  Unusual vaginal discharge or odor.   Call for an appointment:    Patients signature: ______________________  Nurses signature ________________________  Support person's signature_______________________   Post Anesthesia Home Care Instructions NO IBUPROFEN PRODUCTS UNTIL: 6:15 PM TODAY  Activity: Get plenty of rest for  the remainder of the day. A responsible individual must stay with you for 24 hours following the procedure.  For the next 24 hours, DO NOT: -Drive a car -Advertising copywriterperate machinery -Drink alcoholic beverages -Take any medication unless instructed by your physician -Make any legal decisions or sign important papers.  Meals: Start with liquid foods such as gelatin or soup. Progress to regular foods as tolerated. Avoid greasy, spicy, heavy foods. If nausea and/or vomiting occur, drink only clear liquids until the nausea and/or vomiting subsides. Call your physician if vomiting continues.  Special Instructions/Symptoms: Your throat may feel dry or sore from the anesthesia or the breathing tube placed in your throat during surgery. If this causes discomfort, gargle with warm salt water. The discomfort should disappear within 24 hours.  If you had a scopolamine patch placed behind your ear for the management of post- operative nausea and/or vomiting:  1. The medication in the patch is effective for 72 hours, after which it should be removed.  Wrap patch in a tissue and discard in the trash. Wash hands thoroughly with soap and water. 2. You may remove the patch earlier than 72 hours if you experience unpleasant side effects which may include dry mouth, dizziness or visual disturbances. 3. Avoid touching the patch. Wash your hands with soap and water after contact with the patch.

## 2018-08-12 NOTE — Anesthesia Preprocedure Evaluation (Signed)
Anesthesia Evaluation  Patient identified by MRN, date of birth, ID band Patient awake    Reviewed: Allergy & Precautions, NPO status , Patient's Chart, lab work & pertinent test results  Airway Mallampati: II       Dental no notable dental hx. (+) Teeth Intact   Pulmonary sleep apnea ,    Pulmonary exam normal breath sounds clear to auscultation       Cardiovascular Normal cardiovascular exam Rhythm:Regular Rate:Normal     Neuro/Psych negative neurological ROS  negative psych ROS   GI/Hepatic negative GI ROS, Neg liver ROS,   Endo/Other  negative endocrine ROS  Renal/GU negative Renal ROS  negative genitourinary   Musculoskeletal negative musculoskeletal ROS (+)   Abdominal (+) + obese,   Peds  Hematology negative hematology ROS (+)   Anesthesia Other Findings   Reproductive/Obstetrics                             Anesthesia Physical Anesthesia Plan  ASA: III  Anesthesia Plan: General   Post-op Pain Management:    Induction: Intravenous  PONV Risk Score and Plan: 4 or greater and Ondansetron, Dexamethasone and Scopolamine patch - Pre-op  Airway Management Planned: LMA  Additional Equipment:   Intra-op Plan:   Post-operative Plan: Extubation in OR  Informed Consent: I have reviewed the patients History and Physical, chart, labs and discussed the procedure including the risks, benefits and alternatives for the proposed anesthesia with the patient or authorized representative who has indicated his/her understanding and acceptance.   Dental advisory given  Plan Discussed with: CRNA and Surgeon  Anesthesia Plan Comments:         Anesthesia Quick Evaluation

## 2018-08-12 NOTE — Op Note (Signed)
08/12/2018  12:24 PM  PATIENT:  Alisha Brock  42 y.o. female  PRE-OPERATIVE DIAGNOSIS:  7 week MAB  POST-OPERATIVE DIAGNOSIS:  7 week MAB  PROCEDURE:  Procedure(s): DILATATION AND EVACUATION (N/Brock)  SURGEON:  Surgeon(s) and Role:    * Alisha ClampHorvath, Ilyas Lipsitz, MD - Primary  PHYSICIAN ASSISTANT:    ANESTHESIA:   general  EBL:  minimal    SPECIMEN:  Source of Specimen:  uterine curettings  DISPOSITION OF SPECIMEN:  PATHOLOGY  COUNTS:  YES  TOURNIQUET:  * No tourniquets in log *  DICTATION: .Note written in EPIC  PLAN OF CARE: Discharge to home after PACU  PATIENT DISPOSITION:  PACU - hemodynamically stable.   Delay start of Pharmacological VTE agent (>24hrs) due to surgical blood loss or risk of bleeding: not applicable  Medications: Methergine  Complications: None  Findings:  8 week size uterus to 7 size post procedure.  Good crie was achieved.  After adequate anesthesia was achieved, the patient was prepped and draped in the usual sterile fashion.  The speculum was placed in the vagina and the cervix stabilized with Brock single-tooth tenaculum.  The cervix was dilated with Shawnie PonsPratt dilators and the 9 mm curette was used to remove contents of the uterus.  Alternating sharp curettage with Brock curette and suction curettage was performed until all contents were removed and good crie was achieved.  All instruments were removed from the vagina.  The patient tolerated the procedure well.    Alisha Brock

## 2018-08-13 ENCOUNTER — Encounter (HOSPITAL_COMMUNITY): Payer: Self-pay | Admitting: Obstetrics and Gynecology

## 2018-09-05 DIAGNOSIS — H5213 Myopia, bilateral: Secondary | ICD-10-CM | POA: Diagnosis not present

## 2018-09-13 DIAGNOSIS — D239 Other benign neoplasm of skin, unspecified: Secondary | ICD-10-CM | POA: Diagnosis not present

## 2018-09-13 DIAGNOSIS — L309 Dermatitis, unspecified: Secondary | ICD-10-CM | POA: Diagnosis not present

## 2018-12-02 DIAGNOSIS — Z6838 Body mass index (BMI) 38.0-38.9, adult: Secondary | ICD-10-CM | POA: Diagnosis not present

## 2018-12-02 DIAGNOSIS — J069 Acute upper respiratory infection, unspecified: Secondary | ICD-10-CM | POA: Diagnosis not present

## 2018-12-02 DIAGNOSIS — R030 Elevated blood-pressure reading, without diagnosis of hypertension: Secondary | ICD-10-CM | POA: Diagnosis not present

## 2019-01-19 DIAGNOSIS — Z3201 Encounter for pregnancy test, result positive: Secondary | ICD-10-CM | POA: Diagnosis not present

## 2019-01-19 DIAGNOSIS — Z3480 Encounter for supervision of other normal pregnancy, unspecified trimester: Secondary | ICD-10-CM | POA: Diagnosis not present

## 2019-01-19 DIAGNOSIS — Z6837 Body mass index (BMI) 37.0-37.9, adult: Secondary | ICD-10-CM | POA: Diagnosis not present

## 2019-01-19 DIAGNOSIS — N925 Other specified irregular menstruation: Secondary | ICD-10-CM | POA: Diagnosis not present

## 2019-01-19 DIAGNOSIS — Z113 Encounter for screening for infections with a predominantly sexual mode of transmission: Secondary | ICD-10-CM | POA: Diagnosis not present

## 2019-01-25 DIAGNOSIS — Z6839 Body mass index (BMI) 39.0-39.9, adult: Secondary | ICD-10-CM | POA: Diagnosis not present

## 2019-01-25 DIAGNOSIS — O26849 Uterine size-date discrepancy, unspecified trimester: Secondary | ICD-10-CM | POA: Diagnosis not present

## 2019-01-27 DIAGNOSIS — Z6839 Body mass index (BMI) 39.0-39.9, adult: Secondary | ICD-10-CM | POA: Diagnosis not present

## 2019-01-27 DIAGNOSIS — O021 Missed abortion: Secondary | ICD-10-CM | POA: Diagnosis not present

## 2019-01-27 DIAGNOSIS — O3680X9 Pregnancy with inconclusive fetal viability, other fetus: Secondary | ICD-10-CM | POA: Diagnosis not present

## 2019-02-08 DIAGNOSIS — O021 Missed abortion: Secondary | ICD-10-CM | POA: Diagnosis not present

## 2019-02-08 DIAGNOSIS — Z6839 Body mass index (BMI) 39.0-39.9, adult: Secondary | ICD-10-CM | POA: Diagnosis not present

## 2019-02-09 ENCOUNTER — Other Ambulatory Visit: Payer: Self-pay

## 2019-02-09 ENCOUNTER — Other Ambulatory Visit: Payer: Self-pay | Admitting: Obstetrics and Gynecology

## 2019-02-09 ENCOUNTER — Encounter (HOSPITAL_BASED_OUTPATIENT_CLINIC_OR_DEPARTMENT_OTHER): Payer: Self-pay | Admitting: *Deleted

## 2019-02-09 NOTE — Progress Notes (Signed)
Spoke with patient via telephone for pre op interview. NPO after MN. No medications AM of surgery. Arrival time 0530.  

## 2019-02-09 NOTE — Anesthesia Preprocedure Evaluation (Addendum)
Anesthesia Evaluation  Patient identified by MRN, date of birth, ID band Patient awake    Reviewed: Allergy & Precautions, Patient's Chart, lab work & pertinent test results  Airway Mallampati: II  TM Distance: >3 FB Neck ROM: Full    Dental no notable dental hx.    Pulmonary sleep apnea ,    Pulmonary exam normal breath sounds clear to auscultation       Cardiovascular negative cardio ROS Normal cardiovascular exam Rhythm:Regular Rate:Normal     Neuro/Psych negative neurological ROS  negative psych ROS   GI/Hepatic negative GI ROS, Neg liver ROS,   Endo/Other  Morbid obesity  Renal/GU negative Renal ROS     Musculoskeletal negative musculoskeletal ROS (+)   Abdominal (+) + obese,   Peds  Hematology negative hematology ROS (+)   Anesthesia Other Findings MAB  Reproductive/Obstetrics                            Anesthesia Physical Anesthesia Plan  ASA: III  Anesthesia Plan: General   Post-op Pain Management:    Induction: Intravenous  PONV Risk Score and Plan: 4 or greater and Ondansetron, Dexamethasone, Midazolam and Treatment may vary due to age or medical condition  Airway Management Planned: LMA  Additional Equipment:   Intra-op Plan:   Post-operative Plan: Extubation in OR  Informed Consent: I have reviewed the patients History and Physical, chart, labs and discussed the procedure including the risks, benefits and alternatives for the proposed anesthesia with the patient or authorized representative who has indicated his/her understanding and acceptance.     Dental advisory given  Plan Discussed with: CRNA  Anesthesia Plan Comments:        Anesthesia Quick Evaluation

## 2019-02-09 NOTE — H&P (Signed)
43 y.o. with missed ab s/p two doses of cytotec and no response.  Now for D&E.  Only scant bleeding since cytotec.  [place] in office 2 days ago confirmed that IUP was still present and no FHTs.   Past Medical History:  Diagnosis Date  . Anemia   . Obesity   . Sleep apnea    Past Surgical History:  Procedure Laterality Date  . BARIATRIC SURGERY  11/2015  . CESAREAN SECTION  August 2004  . DILATION AND EVACUATION N/A 08/12/2018   Procedure: DILATATION AND EVACUATION;  Surgeon: Carrington Clamp, MD;  Location: [facility];  Service: Gynecology;  Laterality: N/A;  . HERNIA REPAIR Bilateral    Inguinal Hernia Repair at age 43.  . TONSILLECTOMY    . WISDOM TOOTH EXTRACTION  2010    Social History   Socioeconomic History  . Marital status: Married    Spouse name: Not on file  . Number of children: Not on file  . Years of education: Not on file  . Highest education level: Not on file  Occupational History  . Not on file  Social Needs  . Financial resource strain: Not on file  . Food insecurity:    Worry: Not on file    Inability: Not on file  . Transportation needs:    Medical: Not on file    Non-medical: Not on file  Tobacco Use  . Smoking status: Never Smoker  . Smokeless tobacco: Never Used  Substance and Sexual Activity  . Alcohol use: Yes    Comment: occ  . Drug use: No  . Sexual activity: Yes    Birth control/protection: None  Lifestyle  . Physical activity:    Days per week: Not on file    Minutes per session: Not on file  . Stress: Not on file  Relationships  . Social connections:    Talks on phone: Not on file    Gets together: Not on file    Attends religious service: Not on file    Active member of club or organization: Not on file    Attends meetings of clubs or organizations: Not on file    Relationship status: Not on file  . Intimate partner violence:    Fear of current or ex partner: Not on file    Emotionally abused: Not on file    Physically abused: Not  on file    Forced sexual activity: Not on file  Other Topics Concern  . Not on file  Social History Narrative  . Not on file    No current facility-administered medications on file prior to encounter.    Current Outpatient Medications on File Prior to Encounter  Medication Sig Dispense Refill  . cetirizine (ZYRTEC) 10 MG tablet Take 10 mg by mouth daily.    . Cholecalciferol (VITAMIN D3) 10000 units capsule Take 10,000 Units by mouth daily.    . Cyanocobalamin (VITAMIN B 12 PO) Take 1 tablet by mouth daily.    Marland Kitchen Fe Bisgly-Vit C-Vit B12-FA (GENTLE IRON PO) Take 1 tablet by mouth daily.    . folic acid (FOLVITE) 800 MCG tablet Take 800 mcg by mouth daily.    . metroNIDAZOLE (METROGEL) 1 % gel Apply 1 application topically daily as needed.     . Multiple Vitamin (MULTIVITAMIN) tablet Take 1 tablet by mouth daily.      Not on File  Vitals:   02/09/19 1119  Weight: 101.2 kg  Height: 5\' 3"  (1.6 m)  Lungs: clear to ascultation Cor:  RRR Abdomen:  soft, nontender, nondistended. Ex:  no cords, erythema Pelvic:  Closed cervix, about 7 week size uterus.  A:  For D&E.   P: P: All risks, benefits and alternatives d/w patient and she desires to proceed.  Patient will have SCDs during the operation.   Pt has a known blood type of AB POS.   Loney Laurence

## 2019-02-10 ENCOUNTER — Other Ambulatory Visit: Payer: Self-pay

## 2019-02-10 ENCOUNTER — Encounter (HOSPITAL_BASED_OUTPATIENT_CLINIC_OR_DEPARTMENT_OTHER): Payer: Self-pay | Admitting: Emergency Medicine

## 2019-02-10 ENCOUNTER — Ambulatory Visit (HOSPITAL_BASED_OUTPATIENT_CLINIC_OR_DEPARTMENT_OTHER): Payer: BLUE CROSS/BLUE SHIELD | Admitting: Anesthesiology

## 2019-02-10 ENCOUNTER — Encounter (HOSPITAL_BASED_OUTPATIENT_CLINIC_OR_DEPARTMENT_OTHER)
Admission: RE | Disposition: A | Discharge status: Home / Self Care | Payer: Self-pay | Source: Home / Self Care | Attending: Obstetrics and Gynecology

## 2019-02-10 ENCOUNTER — Ambulatory Visit (HOSPITAL_BASED_OUTPATIENT_CLINIC_OR_DEPARTMENT_OTHER)
Admission: RE | Admit: 2019-02-10 | Discharge: 2019-02-10 | Disposition: A | Discharge status: Home / Self Care | Payer: BLUE CROSS/BLUE SHIELD | Attending: Obstetrics and Gynecology | Admitting: Obstetrics and Gynecology

## 2019-02-10 DIAGNOSIS — Z6841 Body Mass Index (BMI) 40.0 and over, adult: Secondary | ICD-10-CM | POA: Insufficient documentation

## 2019-02-10 DIAGNOSIS — O021 Missed abortion: Secondary | ICD-10-CM | POA: Diagnosis not present

## 2019-02-10 DIAGNOSIS — Z9884 Bariatric surgery status: Secondary | ICD-10-CM | POA: Diagnosis not present

## 2019-02-10 DIAGNOSIS — G4730 Sleep apnea, unspecified: Secondary | ICD-10-CM | POA: Insufficient documentation

## 2019-02-10 DIAGNOSIS — Z79899 Other long term (current) drug therapy: Secondary | ICD-10-CM | POA: Insufficient documentation

## 2019-02-10 HISTORY — PX: DILATION AND EVACUATION: SHX1459

## 2019-02-10 LAB — CBC
HCT: 41.7 % (ref 36.0–46.0)
HEMOGLOBIN: 12.8 g/dL (ref 12.0–15.0)
MCH: 26.6 pg (ref 26.0–34.0)
MCHC: 30.7 g/dL (ref 30.0–36.0)
MCV: 86.5 fL (ref 80.0–100.0)
Platelets: 319 10*3/uL (ref 150–400)
RBC: 4.82 MIL/uL (ref 3.87–5.11)
RDW: 13.9 % (ref 11.5–15.5)
WBC: 6.3 10*3/uL (ref 4.0–10.5)
nRBC: 0 % (ref 0.0–0.2)

## 2019-02-10 SURGERY — DILATION AND EVACUATION, UTERUS
Anesthesia: General

## 2019-02-10 MED ORDER — PROPOFOL 10 MG/ML IV BOLUS
INTRAVENOUS | Status: AC
  Filled 2019-02-10: qty 20

## 2019-02-10 MED ORDER — PROPOFOL 10 MG/ML IV BOLUS
INTRAVENOUS | Status: DC | PRN
  Administered 2019-02-10: 200 mg via INTRAVENOUS

## 2019-02-10 MED ORDER — ACETAMINOPHEN 500 MG PO TABS
1000.0000 mg | ORAL_TABLET | Freq: Once | ORAL | Status: AC
  Administered 2019-02-10: 1000 mg via ORAL
  Filled 2019-02-10: qty 2

## 2019-02-10 MED ORDER — HYDROMORPHONE HCL 1 MG/ML IJ SOLN
0.2500 mg | INTRAMUSCULAR | Status: DC | PRN
  Filled 2019-02-10: qty 0.5

## 2019-02-10 MED ORDER — KETOROLAC TROMETHAMINE 30 MG/ML IJ SOLN
INTRAMUSCULAR | Status: DC | PRN
  Administered 2019-02-10: 30 mg via INTRAVENOUS

## 2019-02-10 MED ORDER — KETOROLAC TROMETHAMINE 30 MG/ML IJ SOLN
30.0000 mg | Freq: Once | INTRAMUSCULAR | Status: DC | PRN
  Filled 2019-02-10: qty 1

## 2019-02-10 MED ORDER — SILVER NITRATE-POT NITRATE 75-25 % EX MISC
CUTANEOUS | Status: DC | PRN
  Administered 2019-02-10: 1

## 2019-02-10 MED ORDER — OXYCODONE HCL 5 MG/5ML PO SOLN
5.0000 mg | Freq: Once | ORAL | Status: DC | PRN
  Filled 2019-02-10: qty 5

## 2019-02-10 MED ORDER — SOD CITRATE-CITRIC ACID 500-334 MG/5ML PO SOLN
ORAL | Status: AC
  Filled 2019-02-10: qty 15

## 2019-02-10 MED ORDER — OXYCODONE HCL 5 MG PO TABS
5.0000 mg | ORAL_TABLET | Freq: Once | ORAL | Status: DC | PRN
  Filled 2019-02-10: qty 1

## 2019-02-10 MED ORDER — WHITE PETROLATUM EX OINT
TOPICAL_OINTMENT | CUTANEOUS | Status: AC
  Filled 2019-02-10: qty 5

## 2019-02-10 MED ORDER — ACETAMINOPHEN 500 MG PO TABS
ORAL_TABLET | ORAL | Status: AC
  Filled 2019-02-10: qty 2

## 2019-02-10 MED ORDER — ONDANSETRON HCL 4 MG/2ML IJ SOLN
INTRAMUSCULAR | Status: AC
  Filled 2019-02-10: qty 2

## 2019-02-10 MED ORDER — MIDAZOLAM HCL 2 MG/2ML IJ SOLN
INTRAMUSCULAR | Status: AC
  Filled 2019-02-10: qty 2

## 2019-02-10 MED ORDER — PROMETHAZINE HCL 25 MG/ML IJ SOLN
6.2500 mg | INTRAMUSCULAR | Status: DC | PRN
  Filled 2019-02-10: qty 1

## 2019-02-10 MED ORDER — ONDANSETRON HCL 4 MG/2ML IJ SOLN
INTRAMUSCULAR | Status: DC | PRN
  Administered 2019-02-10: 4 mg via INTRAVENOUS

## 2019-02-10 MED ORDER — METHYLERGONOVINE MALEATE 0.2 MG/ML IJ SOLN
INTRAMUSCULAR | Status: DC | PRN
  Administered 2019-02-10: 0.2 mg via INTRAMUSCULAR

## 2019-02-10 MED ORDER — FENTANYL CITRATE (PF) 100 MCG/2ML IJ SOLN
INTRAMUSCULAR | Status: AC
  Filled 2019-02-10: qty 2

## 2019-02-10 MED ORDER — LIDOCAINE 2% (20 MG/ML) 5 ML SYRINGE
INTRAMUSCULAR | Status: AC
  Filled 2019-02-10: qty 5

## 2019-02-10 MED ORDER — FENTANYL CITRATE (PF) 100 MCG/2ML IJ SOLN
INTRAMUSCULAR | Status: DC | PRN
  Administered 2019-02-10: 25 ug via INTRAVENOUS
  Administered 2019-02-10: 50 ug via INTRAVENOUS
  Administered 2019-02-10: 25 ug via INTRAVENOUS

## 2019-02-10 MED ORDER — LACTATED RINGERS IV SOLN
INTRAVENOUS | Status: DC
  Filled 2019-02-10: qty 1000

## 2019-02-10 MED ORDER — DEXAMETHASONE SODIUM PHOSPHATE 10 MG/ML IJ SOLN
INTRAMUSCULAR | Status: AC
  Filled 2019-02-10: qty 1

## 2019-02-10 MED ORDER — LACTATED RINGERS IV SOLN
INTRAVENOUS | Status: DC
  Administered 2019-02-10: 06:00:00 via INTRAVENOUS
  Filled 2019-02-10: qty 1000

## 2019-02-10 MED ORDER — SOD CITRATE-CITRIC ACID 500-334 MG/5ML PO SOLN
30.0000 mL | ORAL | Status: AC
  Administered 2019-02-10: 30 mL via ORAL
  Filled 2019-02-10: qty 30

## 2019-02-10 MED ORDER — LIDOCAINE 2% (20 MG/ML) 5 ML SYRINGE
INTRAMUSCULAR | Status: DC | PRN
  Administered 2019-02-10: 60 mg via INTRAVENOUS

## 2019-02-10 MED ORDER — DEXAMETHASONE SODIUM PHOSPHATE 10 MG/ML IJ SOLN
INTRAMUSCULAR | Status: DC | PRN
  Administered 2019-02-10: 10 mg via INTRAVENOUS

## 2019-02-10 MED ORDER — MIDAZOLAM HCL 2 MG/2ML IJ SOLN
INTRAMUSCULAR | Status: DC | PRN
  Administered 2019-02-10: 2 mg via INTRAVENOUS

## 2019-02-10 MED ORDER — KETOROLAC TROMETHAMINE 30 MG/ML IJ SOLN
INTRAMUSCULAR | Status: AC
  Filled 2019-02-10: qty 1

## 2019-02-10 SURGICAL SUPPLY — 22 items
CATH ROBINSON RED A/P 16FR (CATHETERS) ×2 IMPLANT
COVER WAND RF STERILE (DRAPES) ×2 IMPLANT
DECANTER SPIKE VIAL GLASS SM (MISCELLANEOUS) ×2 IMPLANT
GLOVE BIO SURGEON STRL SZ7 (GLOVE) ×2 IMPLANT
GLOVE BIO SURGEON STRL SZ8.5 (GLOVE) ×1 IMPLANT
GLOVE BIOGEL PI IND STRL 7.0 (GLOVE) ×1 IMPLANT
GLOVE BIOGEL PI IND STRL 8.5 (GLOVE) IMPLANT
GLOVE BIOGEL PI INDICATOR 7.0 (GLOVE) ×1
GLOVE BIOGEL PI INDICATOR 8.5 (GLOVE) ×1
GOWN STRL REUS W/TWL LRG LVL3 (GOWN DISPOSABLE) ×3 IMPLANT
GOWN STRL REUS W/TWL XL LVL3 (GOWN DISPOSABLE) ×1 IMPLANT
KIT BERKELEY 1ST TRIMESTER 3/8 (MISCELLANEOUS) ×2 IMPLANT
NS IRRIG 1000ML POUR BTL (IV SOLUTION) ×2 IMPLANT
PACK VAGINAL MINOR WOMEN LF (CUSTOM PROCEDURE TRAY) ×2 IMPLANT
PAD OB MATERNITY 4.3X12.25 (PERSONAL CARE ITEMS) ×2 IMPLANT
PAD PREP 24X48 CUFFED NSTRL (MISCELLANEOUS) ×2 IMPLANT
SET BERKELEY SUCTION TUBING (SUCTIONS) ×2 IMPLANT
TOWEL OR 17X26 10 PK STRL BLUE (TOWEL DISPOSABLE) ×3 IMPLANT
VACURETTE 10 RIGID CVD (CANNULA) IMPLANT
VACURETTE 7MM CVD STRL WRAP (CANNULA) IMPLANT
VACURETTE 8 RIGID CVD (CANNULA) ×1 IMPLANT
VACURETTE 9 RIGID CVD (CANNULA) IMPLANT

## 2019-02-10 NOTE — Brief Op Note (Signed)
02/10/2019  7:48 AM  PATIENT:  Alisha Brock  43 y.o. female  PRE-OPERATIVE DIAGNOSIS:  MAB  POST-OPERATIVE DIAGNOSIS:  MAB  PROCEDURE:  Procedure(s): DILATATION AND EVACUATION (N/A)  SURGEON:  Surgeon(s) and Role:    * Carrington Clamp, MD - Primary  ANESTHESIA:   general  EBL:  50 mL   LOCAL MEDICATIONS USED:  NONE  SPECIMEN:  Source of Specimen:  uterine contents, POC  DISPOSITION OF SPECIMEN:  PATHOLOGY  COUNTS:  YES  TOURNIQUET:  * No tourniquets in log *  DICTATION: .Note written in EPIC  PLAN OF CARE: Discharge to home after PACU  PATIENT DISPOSITION:  PACU - hemodynamically stable.   Delay start of Pharmacological VTE agent (>24hrs) due to surgical blood loss or risk of bleeding: not applicable

## 2019-02-10 NOTE — Anesthesia Postprocedure Evaluation (Signed)
Anesthesia Post Note  Patient: Arbie Cookey Dowen  Procedure(s) Performed: DILATATION AND EVACUATION (N/A )     Patient location during evaluation: PACU Anesthesia Type: General Level of consciousness: awake and alert Pain management: pain level controlled Vital Signs Assessment: post-procedure vital signs reviewed and stable Respiratory status: spontaneous breathing, nonlabored ventilation, respiratory function stable and patient connected to nasal cannula oxygen Cardiovascular status: blood pressure returned to baseline and stable Postop Assessment: no apparent nausea or vomiting Anesthetic complications: no    Last Vitals:  Vitals:   02/10/19 0850 02/10/19 0934  BP:  132/82  Pulse:  (!) 104  Resp: 16 14  Temp: 36.9 C 37.8 C  SpO2:  100%    Last Pain:  Vitals:   02/10/19 0934  TempSrc: Oral  PainSc: 0-No pain                 Ryan P Ellender

## 2019-02-10 NOTE — Anesthesia Procedure Notes (Signed)
Procedure Name: LMA Insertion Date/Time: 02/10/2019 7:29 AM Performed by: Francie Massing, CRNA Pre-anesthesia Checklist: Patient identified, Emergency Drugs available, Suction available and Patient being monitored Patient Re-evaluated:Patient Re-evaluated prior to induction Oxygen Delivery Method: Circle system utilized Preoxygenation: Pre-oxygenation with 100% oxygen Induction Type: IV induction Ventilation: Mask ventilation without difficulty LMA: LMA inserted LMA Size: 4.0 Number of attempts: 1 Airway Equipment and Method: Bite block Placement Confirmation: positive ETCO2 Tube secured with: Tape Dental Injury: Teeth and Oropharynx as per pre-operative assessment

## 2019-02-10 NOTE — Transfer of Care (Signed)
Immediate Anesthesia Transfer of Care Note  Patient: Alisha Brock Likes  Procedure(s) Performed: Procedure(s) (LRB): DILATATION AND EVACUATION (N/A)  Patient Location: PACU  Anesthesia Type: General  Level of Consciousness: awake, oriented, sedated and patient cooperative  Airway & Oxygen Therapy: Patient Spontanous Breathing and Patient connected to face mask oxygen  Post-op Assessment: Report given to PACU RN and Post -op Vital signs reviewed and stable  Post vital signs: Reviewed and stable  Complications: No apparent anesthesia complications  Last Vitals:  Vitals Value Taken Time  BP 145/84 02/10/2019  7:58 AM  Temp    Pulse 115 02/10/2019  7:59 AM  Resp 16 02/10/2019  7:59 AM  SpO2 100 % 02/10/2019  7:59 AM  Vitals shown include unvalidated device data.  Last Pain:  Vitals:   02/10/19 0617  TempSrc: Oral

## 2019-02-10 NOTE — Progress Notes (Signed)
There has been no change in the patients history, status or exam since the history and physical.  Vitals:   02/09/19 1119 02/10/19 0617  BP:  124/83  Pulse:  85  Resp:  16  Temp:  99.1 F (37.3 C)  TempSrc:  Oral  SpO2:  100%  Weight: 101.2 kg 102.5 kg  Height: 5\' 3"  (1.6 m) 5\' 3"  (1.6 m)    Results for orders placed or performed during the hospital encounter of 02/10/19 (from the past 72 hour(s))  CBC     Status: None   Collection Time: 02/10/19  6:00 AM  Result Value Ref Range   WBC 6.3 4.0 - 10.5 K/uL   RBC 4.82 3.87 - 5.11 MIL/uL   Hemoglobin 12.8 12.0 - 15.0 g/dL   HCT 16.1 09.6 - 04.5 %   MCV 86.5 80.0 - 100.0 fL   MCH 26.6 26.0 - 34.0 pg   MCHC 30.7 30.0 - 36.0 g/dL   RDW 40.9 81.1 - 91.4 %   Platelets 319 150 - 400 K/uL   nRBC 0.0 0.0 - 0.2 %    Comment: Performed at [facility], [address], [place], [place] [address]    Loney Laurence

## 2019-02-10 NOTE — Op Note (Signed)
02/10/2019  7:48 AM  PATIENT:  Alisha Brock  43 y.o. female  PRE-OPERATIVE DIAGNOSIS:  MAB  POST-OPERATIVE DIAGNOSIS:  MAB  PROCEDURE:  Procedure(s): DILATATION AND EVACUATION (N/A)  SURGEON:  Surgeon(s) and Role:    * Carrington Clamp, MD - Primary  ANESTHESIA:   general  EBL:  50 mL   LOCAL MEDICATIONS USED:  NONE  SPECIMEN:  Source of Specimen:  uterine contents, POC  DISPOSITION OF SPECIMEN:  PATHOLOGY  COUNTS:  YES  TOURNIQUET:  * No tourniquets in log *  DICTATION: .Note written in EPIC  PLAN OF CARE: Discharge to home after PACU  PATIENT DISPOSITION:  PACU - hemodynamically stable.   Delay start of Pharmacological VTE agent (>24hrs) due to surgical blood loss or risk of bleeding: not applicable  Medications: Methergine  Complications: None  Findings:  8 week size uterus to 6 size post procedure.  Good crie was achieved.  After adequate anesthesia was achieved, the patient was prepped and draped in the usual sterile fashion.  The speculum was placed in the vagina and the cervix stabilized with a single-tooth tenaculum.  The cervix was dilated with Shawnie Pons dilators and the 9 mm curette was used to remove contents of the uterus.  Alternating sharp curettage with a curette and suction curettage was performed until all contents were removed and good crie was achieved.  All instruments were removed from the vagina.  The patient tolerated the procedure well.    Halle Davlin A

## 2019-02-10 NOTE — Discharge Instructions (Signed)

## 2019-02-13 ENCOUNTER — Encounter (HOSPITAL_BASED_OUTPATIENT_CLINIC_OR_DEPARTMENT_OTHER): Payer: Self-pay | Admitting: Obstetrics and Gynecology

## 2019-02-13 NOTE — Addendum Note (Signed)
Addendum  created 02/13/19 0738 by Francie Massing, CRNA   Charge Capture section accepted

## 2019-04-17 DIAGNOSIS — D485 Neoplasm of uncertain behavior of skin: Secondary | ICD-10-CM | POA: Diagnosis not present

## 2019-04-17 DIAGNOSIS — L719 Rosacea, unspecified: Secondary | ICD-10-CM | POA: Diagnosis not present

## 2019-04-17 DIAGNOSIS — L919 Hypertrophic disorder of the skin, unspecified: Secondary | ICD-10-CM | POA: Diagnosis not present

## 2019-09-26 DIAGNOSIS — H5213 Myopia, bilateral: Secondary | ICD-10-CM | POA: Diagnosis not present

## 2019-10-10 ENCOUNTER — Other Ambulatory Visit: Payer: Self-pay

## 2019-10-10 DIAGNOSIS — Z20822 Contact with and (suspected) exposure to covid-19: Secondary | ICD-10-CM

## 2019-10-11 LAB — NOVEL CORONAVIRUS, NAA: SARS-CoV-2, NAA: NOT DETECTED

## 2019-11-10 ENCOUNTER — Other Ambulatory Visit: Payer: Self-pay

## 2019-11-10 DIAGNOSIS — Z20822 Contact with and (suspected) exposure to covid-19: Secondary | ICD-10-CM

## 2019-11-13 LAB — NOVEL CORONAVIRUS, NAA: SARS-CoV-2, NAA: NOT DETECTED

## 2020-01-15 ENCOUNTER — Other Ambulatory Visit: Payer: Self-pay

## 2020-01-15 ENCOUNTER — Ambulatory Visit: Payer: BC Managed Care – PPO | Attending: Internal Medicine

## 2020-01-15 DIAGNOSIS — Z20822 Contact with and (suspected) exposure to covid-19: Secondary | ICD-10-CM | POA: Insufficient documentation

## 2020-01-17 LAB — NOVEL CORONAVIRUS, NAA: SARS-CoV-2, NAA: NOT DETECTED

## 2020-03-20 DIAGNOSIS — Z01419 Encounter for gynecological examination (general) (routine) without abnormal findings: Secondary | ICD-10-CM | POA: Diagnosis not present

## 2020-03-20 DIAGNOSIS — Z1231 Encounter for screening mammogram for malignant neoplasm of breast: Secondary | ICD-10-CM | POA: Diagnosis not present

## 2020-03-20 DIAGNOSIS — Z6841 Body Mass Index (BMI) 40.0 and over, adult: Secondary | ICD-10-CM | POA: Diagnosis not present

## 2020-04-04 DIAGNOSIS — R030 Elevated blood-pressure reading, without diagnosis of hypertension: Secondary | ICD-10-CM | POA: Diagnosis not present

## 2020-05-06 DIAGNOSIS — Z634 Disappearance and death of family member: Secondary | ICD-10-CM | POA: Diagnosis not present

## 2020-05-09 ENCOUNTER — Other Ambulatory Visit: Payer: Self-pay

## 2020-05-09 ENCOUNTER — Encounter: Payer: Self-pay | Admitting: Family Medicine

## 2020-05-09 ENCOUNTER — Ambulatory Visit (INDEPENDENT_AMBULATORY_CARE_PROVIDER_SITE_OTHER): Payer: BC Managed Care – PPO | Admitting: Family Medicine

## 2020-05-09 VITALS — BP 140/100 | HR 87 | Temp 97.7°F | Resp 16 | Ht 63.0 in | Wt 243.0 lb

## 2020-05-09 DIAGNOSIS — R5383 Other fatigue: Secondary | ICD-10-CM | POA: Diagnosis not present

## 2020-05-09 DIAGNOSIS — I10 Essential (primary) hypertension: Secondary | ICD-10-CM

## 2020-05-09 DIAGNOSIS — Z9884 Bariatric surgery status: Secondary | ICD-10-CM

## 2020-05-09 DIAGNOSIS — D539 Nutritional anemia, unspecified: Secondary | ICD-10-CM | POA: Diagnosis not present

## 2020-05-09 DIAGNOSIS — E559 Vitamin D deficiency, unspecified: Secondary | ICD-10-CM | POA: Diagnosis not present

## 2020-05-09 DIAGNOSIS — F329 Major depressive disorder, single episode, unspecified: Secondary | ICD-10-CM

## 2020-05-09 DIAGNOSIS — J3089 Other allergic rhinitis: Secondary | ICD-10-CM | POA: Diagnosis not present

## 2020-05-09 DIAGNOSIS — F32A Depression, unspecified: Secondary | ICD-10-CM

## 2020-05-09 MED ORDER — LEVOCETIRIZINE DIHYDROCHLORIDE 5 MG PO TABS: 5.0000 mg | ORAL_TABLET | Freq: Every evening | ORAL | 1 refills | Status: DC

## 2020-05-09 NOTE — Assessment & Plan Note (Signed)
She does have depression mild is currently working to find a good therapist is seeing 2 at this time and looking to see which one works best for her.  At this time we will not do any medication she denies having any SI or HI.

## 2020-05-09 NOTE — Assessment & Plan Note (Signed)
Will be switching her from Zyrtec to Xyzal to see if she gets better control of her postnasal drip.

## 2020-05-09 NOTE — Assessment & Plan Note (Signed)
History of bariatric surgery in 2016.  She has not had updated labs since 2019.  She is fasting this morning we will get updated labs today.  She reports she takes all of her vitamins that her doctor had recommended for her.

## 2020-05-09 NOTE — Assessment & Plan Note (Signed)
Blood pressure elevated today in the office.  Even on recheck.  She is not against going on anything for blood pressure control.  Will be getting updated labs to see what kidney function is prior to starting something.  She reports that she does not drink a lot of water would be worried about giving her diuretic possibly looking at ACE and or Norvasc start. I have encouraged her to review her diet focusing on DASH diet and to get back into an exercise regime as well.

## 2020-05-09 NOTE — Assessment & Plan Note (Signed)
Does have excessive fatigue might be related to previous history of anemia will be getting updated labs.  Also will be checking her thyroid.

## 2020-05-09 NOTE — Progress Notes (Signed)
Subjective:  Patient ID: Arlester Marker, female    DOB: 04/12/1976  Age: 44 y.o. MRN: [id number]  CC:  Chief Complaint  Patient presents with  . Establish Care  . Hypertension    BP has been elevated       HPI  HPI   Ms. Deviney is a 44 year old female patient who presents today to establish care.  She has a history significant for allergies, anemia, herniated lumbar disc, obesity status post bariatric surgery in 2016, sleep apnea currently not using CPAP.  She reports taking all her medications without any issues.  She is on birth control from her GYN she is up-to-date on her Pap smears.  She reports taking her vitamins as directed by her bariatric surgeon.  Is not having any issues with that.  She does report that she fell off the wagon as far as when Covid hit it was a lot harder for her to work out and eating habits had adjusted and changed.  She has had no labs in over a year.  She is fasting this morning and able to get labs this morning.  She is willing to go on something for blood pressure control if that is necessary. She is tired a lot of the times.  She is unsure if that is just because she is put on weight or if her anemia is present.  She is also reported that she has had a little bit more difficult time sleeping at night.  She is unsure if that is because she is put on weight and if she needs to start using her CPAP machine again. She reports going to the dentist regularly, eye doctor regularly and has received her Covid vaccines.  Today patient denies signs and symptoms of COVID 19 infection including fever, chills, cough, shortness of breath, and headache. Past Medical, Surgical, Social History, Allergies, and Medications have been Reviewed.   Past Medical History:  Diagnosis Date  . Allergy   . Anemia   . Herniated lumbar intervertebral disc   . Obesity   . Sleep apnea     Current Meds  Medication Sig  . cetirizine (ZYRTEC) 10 MG tablet Take  10 mg by mouth daily.  . Cyanocobalamin (VITAMIN B 12 PO) Take 1 tablet by mouth daily.  Marland Kitchen Fe Bisgly-Vit C-Vit B12-FA (GENTLE IRON PO) Take 1 tablet by mouth daily.  Thurston Pounds 0.15-0.03 MG tablet Take 1 tablet by mouth daily.    ROS:  Review of Systems  Constitutional: Negative.   HENT: Positive for congestion and sore throat.        Postnasal drip  Eyes: Negative.   Respiratory: Negative.   Cardiovascular: Negative.   Genitourinary: Negative.   Musculoskeletal: Negative.   Skin: Negative.   Neurological: Negative.   Endo/Heme/Allergies: Negative.   Psychiatric/Behavioral: Positive for depression.       Does not sleep well at this time  All other systems reviewed and are negative.    Objective:   Today's Vitals: BP (!) 140/100   Pulse 87   Temp 97.7 F (36.5 C) (Temporal)   Resp 16   Ht 5\' 3"  (1.6 m)   Wt 243 lb (110.2 kg)   SpO2 99%   BMI 43.05 kg/m  Vitals with BMI 05/09/2020 02/10/2019 02/10/2019  Height 5\' 3"  - -  Weight 243 lbs - -  BMI 78.29 - -  Systolic 562 130 865  Diastolic 784 82 82  Pulse 87 104 84  Physical Exam Vitals and nursing note reviewed.  Constitutional:      Appearance: Normal appearance. She is well-developed and well-groomed. She is obese.  HENT:     Head: Normocephalic and atraumatic.     Right Ear: External ear normal.     Left Ear: External ear normal.  Eyes:     General:        Right eye: No discharge.        Left eye: No discharge.     Conjunctiva/sclera: Conjunctivae normal.     Comments: glasses  Cardiovascular:     Rate and Rhythm: Normal rate and regular rhythm.     Pulses: Normal pulses.     Heart sounds: Normal heart sounds.  Pulmonary:     Effort: Pulmonary effort is normal.     Breath sounds: Normal breath sounds.  Musculoskeletal:        General: Normal range of motion.     Cervical back: Normal range of motion and neck supple.  Skin:    General: Skin is warm.  Neurological:     General: No focal deficit  present.     Mental Status: She is alert and oriented to person, place, and time.  Psychiatric:        Attention and Perception: Attention normal.        Mood and Affect: Mood normal.        Speech: Speech normal.        Behavior: Behavior normal. Behavior is cooperative.        Thought Content: Thought content normal.        Cognition and Memory: Cognition normal.        Judgment: Judgment normal.    Depression screen PHQ 2/9 05/09/2020  Decreased Interest 1  Down, Depressed, Hopeless 2  PHQ - 2 Score 3  Altered sleeping 1  Tired, decreased energy 2  Change in appetite 1  Feeling bad or failure about yourself  0  Trouble concentrating 1  Moving slowly or fidgety/restless 0  Suicidal thoughts 0  PHQ-9 Score 8  Difficult doing work/chores Somewhat difficult    Assessment   1. Essential hypertension   2. Environmental and seasonal allergies   3. H/O bariatric surgery   4. Other fatigue     Tests ordered Orders Placed This Encounter  Procedures  . CBC  . COMPLETE METABOLIC PANEL WITH GFR  . Hemoglobin A1c  . Lipid panel  . VITAMIN D 25 Hydroxy (Vit-D Deficiency, Fractures)  . TSH  . Vitamin B12     Plan: Please see assessment and plan per problem list above.   Meds ordered this encounter  Medications  . levocetirizine (XYZAL) 5 MG tablet    Sig: Take 1 tablet (5 mg total) by mouth every evening.    Dispense:  30 tablet    Refill:  1    Order Specific Question:   Supervising Provider    Answer:   Genia Harold    Patient to follow-up in 08/08/2020  Freddy Finner, NP

## 2020-05-09 NOTE — Patient Instructions (Signed)
I appreciate the opportunity to provide you with care for your health and wellness. Today we discussed: establish care  Follow up: 3 months BP check  Labs today-fasting  No referrals today  GREAT TO MEET YOU!   Please continue to practice social distancing to keep you, your family, and our community safe.  If you must go out, please wear a mask and practice good handwashing.  It was a pleasure to see you and I look forward to continuing to work together on your health and well-being. Please do not hesitate to call the office if you need care or have questions about your care.  Have a wonderful day and week. With Gratitude, Tereasa Coop, DNP, AGNP-BC

## 2020-05-10 ENCOUNTER — Other Ambulatory Visit: Payer: Self-pay | Admitting: Family Medicine

## 2020-05-10 LAB — LIPID PANEL
Cholesterol: 157 mg/dL (ref <200)
HDL: 46 mg/dL — ABNORMAL LOW (ref >=50)
LDL Cholesterol (Calc): 96 mg/dL (calc)
Non-HDL Cholesterol (Calc): 111 mg/dL (calc) (ref <130)
Total CHOL/HDL Ratio: 3.4 (calc) (ref <5.0)
Triglycerides: 60 mg/dL (ref <150)

## 2020-05-10 LAB — COMPLETE METABOLIC PANEL WITH GFR
AG Ratio: 1.3 (calc) (ref 1.0–2.5)
ALT: 7 U/L (ref 6–29)
AST: 12 U/L (ref 10–30)
Albumin: 3.5 g/dL — ABNORMAL LOW (ref 3.6–5.1)
Alkaline phosphatase (APISO): 50 U/L (ref 31–125)
BUN/Creatinine Ratio: 8 (calc) (ref 6–22)
BUN: 6 mg/dL — ABNORMAL LOW (ref 7–25)
CO2: 27 mmol/L (ref 20–32)
Calcium: 8.7 mg/dL (ref 8.6–10.2)
Chloride: 105 mmol/L (ref 98–110)
Creat: 0.77 mg/dL (ref 0.50–1.10)
GFR, Est African American: 110 mL/min/{1.73_m2} (ref >=60)
GFR, Est Non African American: 95 mL/min/{1.73_m2} (ref >=60)
Globulin: 2.6 g/dL (calc) (ref 1.9–3.7)
Glucose, Bld: 83 mg/dL (ref 65–99)
Potassium: 4.3 mmol/L (ref 3.5–5.3)
Sodium: 138 mmol/L (ref 135–146)
Total Bilirubin: 0.6 mg/dL (ref 0.2–1.2)
Total Protein: 6.1 g/dL (ref 6.1–8.1)

## 2020-05-10 LAB — HEMOGLOBIN A1C
Hgb A1c MFr Bld: 5.7 % of total Hgb — ABNORMAL HIGH (ref <5.7)
Mean Plasma Glucose: 117 (calc)
eAG (mmol/L): 6.5 (calc)

## 2020-05-10 LAB — CBC
HCT: 41.2 % (ref 35.0–45.0)
Hemoglobin: 13.3 g/dL (ref 11.7–15.5)
MCH: 27 pg (ref 27.0–33.0)
MCHC: 32.3 g/dL (ref 32.0–36.0)
MCV: 83.7 fL (ref 80.0–100.0)
MPV: 10.6 fL (ref 7.5–12.5)
Platelets: 383 10*3/uL (ref 140–400)
RBC: 4.92 10*6/uL (ref 3.80–5.10)
RDW: 13.1 % (ref 11.0–15.0)
WBC: 6.1 10*3/uL (ref 3.8–10.8)

## 2020-05-10 LAB — VITAMIN B12: Vitamin B-12: 440 pg/mL (ref 200–1100)

## 2020-05-10 LAB — TSH: TSH: 1.31 mIU/L

## 2020-05-14 ENCOUNTER — Encounter: Payer: Self-pay | Admitting: Family Medicine

## 2020-05-15 ENCOUNTER — Encounter: Payer: Self-pay | Admitting: Family Medicine

## 2020-05-20 DIAGNOSIS — Z634 Disappearance and death of family member: Secondary | ICD-10-CM | POA: Diagnosis not present

## 2020-05-22 ENCOUNTER — Other Ambulatory Visit: Payer: Self-pay | Admitting: Family Medicine

## 2020-05-22 ENCOUNTER — Encounter: Payer: Self-pay | Admitting: Family Medicine

## 2020-05-22 DIAGNOSIS — I10 Essential (primary) hypertension: Secondary | ICD-10-CM

## 2020-05-22 MED ORDER — LISINOPRIL 10 MG PO TABS: 10.0000 mg | ORAL_TABLET | Freq: Every day | ORAL | 2 refills | Status: DC

## 2020-05-27 DIAGNOSIS — Z634 Disappearance and death of family member: Secondary | ICD-10-CM | POA: Diagnosis not present

## 2020-05-30 ENCOUNTER — Ambulatory Visit: Payer: BC Managed Care – PPO | Attending: Internal Medicine

## 2020-05-30 DIAGNOSIS — Z20822 Contact with and (suspected) exposure to covid-19: Secondary | ICD-10-CM

## 2020-05-31 LAB — NOVEL CORONAVIRUS, NAA: SARS-CoV-2, NAA: NOT DETECTED

## 2020-05-31 LAB — SARS-COV-2, NAA 2 DAY TAT

## 2020-06-12 ENCOUNTER — Other Ambulatory Visit: Payer: BC Managed Care – PPO

## 2020-06-12 ENCOUNTER — Ambulatory Visit: Payer: BC Managed Care – PPO | Attending: Internal Medicine

## 2020-06-12 DIAGNOSIS — Z20822 Contact with and (suspected) exposure to covid-19: Secondary | ICD-10-CM | POA: Diagnosis not present

## 2020-06-13 LAB — NOVEL CORONAVIRUS, NAA: SARS-CoV-2, NAA: NOT DETECTED

## 2020-06-13 LAB — SARS-COV-2, NAA 2 DAY TAT

## 2020-07-08 ENCOUNTER — Other Ambulatory Visit: Payer: Self-pay

## 2020-07-08 DIAGNOSIS — F332 Major depressive disorder, recurrent severe without psychotic features: Secondary | ICD-10-CM | POA: Diagnosis not present

## 2020-07-08 DIAGNOSIS — J3089 Other allergic rhinitis: Secondary | ICD-10-CM

## 2020-07-08 MED ORDER — LEVOCETIRIZINE DIHYDROCHLORIDE 5 MG PO TABS: 5.0000 mg | ORAL_TABLET | Freq: Every evening | ORAL | 1 refills | Status: DC

## 2020-07-29 DIAGNOSIS — Z634 Disappearance and death of family member: Secondary | ICD-10-CM | POA: Diagnosis not present

## 2020-08-08 ENCOUNTER — Encounter: Payer: Self-pay | Admitting: Family Medicine

## 2020-08-08 ENCOUNTER — Other Ambulatory Visit: Payer: Self-pay

## 2020-08-08 ENCOUNTER — Ambulatory Visit: Payer: BC Managed Care – PPO | Admitting: Family Medicine

## 2020-08-08 DIAGNOSIS — F32A Depression, unspecified: Secondary | ICD-10-CM

## 2020-08-08 DIAGNOSIS — F329 Major depressive disorder, single episode, unspecified: Secondary | ICD-10-CM

## 2020-08-08 MED ORDER — PHENTERMINE HCL 37.5 MG PO TABS: 18.7500 mg | ORAL_TABLET | Freq: Every day | ORAL | 0 refills | Status: DC

## 2020-08-08 NOTE — Assessment & Plan Note (Signed)
Reports doing really well with therapist.  She had decided on the one that worked best for her unfortunately this person got into a car accident she is going to be seeing somebody else for a while until her therapist heals and does come back to work.  Does not want any medication at this time.  Denies having any SI or HI.

## 2020-08-08 NOTE — Progress Notes (Signed)
Subjective:  Patient ID: Alisha Brock, female    DOB: 1976/01/20  Age: 44 y.o. MRN: [id number]  CC:  Chief Complaint  Patient presents with  . Hypertension    follow up  re-evaluate bp      HPI  HPI   Alisha Brock is a 44 year old female patient of mine who presents today to follow-up on blood pressure control to see if she is able to get her back on phentermine for weight loss.  She has a history that is significant for obesity, bariatric surgery, anemia, allergies, sleep apnea.  Reports she is taking all her meds as directed.  She does take her meds at night.  They can make her sleepy.  Tolerating them without any issue or concern.  Blood pressures at home range from 110-140/70-90.  She reports the higher end of that is rarely seen now compared to what it was.  She reports that when she is in pain distress or has a headache that is usually when she notices that she takes deep breaths relaxes and then the blood pressure comes down.  She reports that she is going to therapy and doing very well with that.  Her recent therapist got into a car accident so she is going to be switched off to somebody else in the interim but hopefully she can get back with the therapist she was with.  She recently went to [place] did well had her Covid vaccines practice safe distancing has not had any signs or symptoms of Covid or exposure that she is aware of.  Denies chest pain, cough, shortness of breath, headaches, dizziness, vision changes, skin changes trouble going to the bathroom or eating or drinking.  Today patient denies signs and symptoms of COVID 19 infection including fever, chills, cough, shortness of breath, and headache. Past Medical, Surgical, Social History, Allergies, and Medications have been Reviewed.   Past Medical History:  Diagnosis Date  . Allergy   . Anemia   . Herniated lumbar intervertebral disc   . Obesity   . Sleep apnea     Current Meds    Medication Sig  . Cyanocobalamin (VITAMIN B 12 PO) Take 1 tablet by mouth daily.  Marland Kitchen Fe Bisgly-Vit C-Vit B12-FA (GENTLE IRON PO) Take 1 tablet by mouth daily.  Marcille Blanco 0.15-0.03 MG tablet Take 1 tablet by mouth daily.  Marland Kitchen levocetirizine (XYZAL) 5 MG tablet Take 1 tablet (5 mg total) by mouth every evening.  Marland Kitchen lisinopril (ZESTRIL) 10 MG tablet Take 1 tablet (10 mg total) by mouth daily.    ROS:  Review of Systems  Constitutional: Negative.   HENT: Negative.   Eyes: Negative.   Respiratory: Negative.   Cardiovascular: Negative.   Gastrointestinal: Negative.   Genitourinary: Negative.   Musculoskeletal: Negative.   Skin: Negative.   Neurological: Negative.   Endo/Heme/Allergies: Negative.   Psychiatric/Behavioral: Negative.   All other systems reviewed and are negative.    Objective:   Today's Vitals: BP 118/78   Pulse 83   Resp 16   Ht 5\' 3"  (1.6 m)   Wt 241 lb (109.3 kg)   SpO2 98%   BMI 42.69 kg/m  Vitals with BMI 08/08/2020 05/09/2020 02/10/2019  Height 5\' 3"  5\' 3"  -  Weight 241 lbs 243 lbs -  BMI 42.7 43.06 -  Systolic 118 140 02/12/2019  Diastolic 78 100 82  Pulse 83 87 104     Physical Exam Vitals and nursing note reviewed.  Constitutional:  Appearance: Normal appearance. She is well-developed and well-groomed. She is morbidly obese.  HENT:     Head: Normocephalic and atraumatic.     Right Ear: External ear normal.     Left Ear: External ear normal.     Mouth/Throat:     Comments: Mask in place  Eyes:     General:        Right eye: No discharge.        Left eye: No discharge.     Conjunctiva/sclera: Conjunctivae normal.  Cardiovascular:     Rate and Rhythm: Normal rate and regular rhythm.     Pulses: Normal pulses.     Heart sounds: Normal heart sounds.  Pulmonary:     Effort: Pulmonary effort is normal.     Breath sounds: Normal breath sounds.  Musculoskeletal:        General: Normal range of motion.     Cervical back: Normal range of motion and  neck supple.  Skin:    General: Skin is warm.  Neurological:     General: No focal deficit present.     Mental Status: She is alert and oriented to person, place, and time.  Psychiatric:        Attention and Perception: Attention normal.        Mood and Affect: Mood normal.        Speech: Speech normal.        Behavior: Behavior normal. Behavior is cooperative.        Thought Content: Thought content normal.        Cognition and Memory: Cognition normal.        Judgment: Judgment normal.      Assessment   1. Morbid obesity (HCC)   2. Depression, unspecified depression type     Tests ordered No orders of the defined types were placed in this encounter.    Plan: Please see assessment and plan per problem list above.   Meds ordered this encounter  Medications  . phentermine (ADIPEX-P) 37.5 MG tablet    Sig: Take 0.5 tablets (18.75 mg total) by mouth daily before breakfast.    Dispense:  15 tablet    Refill:  0    Order Specific Question:   Supervising Provider    Answer:   Kerri Perches [[id number]]    Patient to follow-up in 4 weeks.  Freddy Finner, NP

## 2020-08-08 NOTE — Patient Instructions (Addendum)
I appreciate the opportunity to provide you with care for your health and wellness. Today we discussed: BP check in  Follow up: 4 weeks for wt check   No labs or referrals today  Great news on BP being better controlled!  Take Adipex as directed, we will do weight check in next month.  Please continue to practice social distancing to keep you, your family, and our community safe.  If you must go out, please wear a mask and practice good handwashing.  It was a pleasure to see you and I look forward to continuing to work together on your health and well-being. Please do not hesitate to call the office if you need care or have questions about your care.  Have a wonderful day and week. With Gratitude, Tereasa Coop, DNP, AGNP-BC

## 2020-08-08 NOTE — Assessment & Plan Note (Addendum)
Obesity is linked to hypertension and depression She has gained around 20 pounds in the last year and a half. Would like to try phentermine low-dose to see if that is helpful for her.  She has gotten her blood pressure under control to be able to go back on it.  Does have history of having bariatric surgery in 2016.  Education on the importance of lifestyle changes in diet and exercise were stressed.  Wt Readings from Last 3 Encounters:  08/08/20 241 lb (109.3 kg)  05/09/20 243 lb (110.2 kg)  02/10/19 226 lb (102.5 kg)

## 2020-08-12 ENCOUNTER — Other Ambulatory Visit: Payer: Self-pay

## 2020-08-12 DIAGNOSIS — Z634 Disappearance and death of family member: Secondary | ICD-10-CM | POA: Diagnosis not present

## 2020-08-12 DIAGNOSIS — J3089 Other allergic rhinitis: Secondary | ICD-10-CM

## 2020-08-12 MED ORDER — LEVOCETIRIZINE DIHYDROCHLORIDE 5 MG PO TABS: 5.0000 mg | ORAL_TABLET | Freq: Every evening | ORAL | 1 refills | Status: DC

## 2020-08-20 ENCOUNTER — Encounter: Payer: Self-pay | Admitting: Family Medicine

## 2020-08-20 NOTE — Telephone Encounter (Signed)
Lets get her into [place], thanks so much

## 2020-08-21 ENCOUNTER — Other Ambulatory Visit: Payer: Self-pay | Admitting: *Deleted

## 2020-08-21 DIAGNOSIS — R5383 Other fatigue: Secondary | ICD-10-CM

## 2020-08-28 ENCOUNTER — Other Ambulatory Visit: Payer: Self-pay

## 2020-08-28 ENCOUNTER — Other Ambulatory Visit: Payer: BC Managed Care – PPO

## 2020-08-28 DIAGNOSIS — Z20822 Contact with and (suspected) exposure to covid-19: Secondary | ICD-10-CM | POA: Diagnosis not present

## 2020-08-28 DIAGNOSIS — Z634 Disappearance and death of family member: Secondary | ICD-10-CM | POA: Diagnosis not present

## 2020-08-29 ENCOUNTER — Other Ambulatory Visit: Payer: Self-pay

## 2020-08-29 ENCOUNTER — Encounter: Payer: Self-pay | Admitting: Family Medicine

## 2020-08-29 DIAGNOSIS — I10 Essential (primary) hypertension: Secondary | ICD-10-CM

## 2020-08-29 MED ORDER — LISINOPRIL 10 MG PO TABS: 10.0000 mg | ORAL_TABLET | Freq: Every day | ORAL | 0 refills | Status: DC

## 2020-08-30 LAB — SARS-COV-2, NAA 2 DAY TAT

## 2020-08-30 LAB — NOVEL CORONAVIRUS, NAA: SARS-CoV-2, NAA: NOT DETECTED

## 2020-09-05 ENCOUNTER — Other Ambulatory Visit: Payer: Self-pay

## 2020-09-05 ENCOUNTER — Ambulatory Visit: Payer: BC Managed Care – PPO | Admitting: Family Medicine

## 2020-09-05 ENCOUNTER — Ambulatory Visit (INDEPENDENT_AMBULATORY_CARE_PROVIDER_SITE_OTHER): Payer: BC Managed Care – PPO | Admitting: Family Medicine

## 2020-09-05 ENCOUNTER — Encounter: Payer: Self-pay | Admitting: Family Medicine

## 2020-09-05 MED ORDER — PHENTERMINE HCL 37.5 MG PO TABS: 37.5000 mg | ORAL_TABLET | Freq: Every day | ORAL | 0 refills | Status: DC

## 2020-09-05 NOTE — Assessment & Plan Note (Signed)
Obesity is linked to hypertension and depression She has been able lose 8 pounds in the last month of being on low-dose phentermine.  Will increase to full dose.  Blood pressure is well controlled no side effects of being on phentermine.  Follow-up in 4 weeks.  I have encouraged her to continue the diet and lifestyle changes that she is making..  Wt Readings from Last 3 Encounters:  09/05/20 233 lb 12.8 oz (106.1 kg)  08/08/20 241 lb (109.3 kg)  05/09/20 243 lb (110.2 kg)

## 2020-09-05 NOTE — Progress Notes (Signed)
Subjective:  Patient ID: Alisha Brock, female    DOB: August 08, 1976  Age: 44 y.o. MRN: [id number]  CC:  Chief Complaint  Patient presents with  . Follow-up    4 week follow up wt check and bp bp has been good at home pt is losing weight       HPI  HPI  Alisha Brock is here for follow up regarding weight. Reports is following a low fat heart healthy diet. Reports is working on being more active with  exercising.  Reports drinking water daily, but struggles to get adequate amount. Reports sleeping daily.  Reports feeling very well.  She has not had any trouble taking the half dose of the phentermine.  She went out of town and was able to make good food choices.  She denies having any headaches, chest pain, blurred vision, dizziness, rapid heart rate or any other signs or symptoms of intolerance of medication.  Blood pressure is well controlled.  Does have a demonstrated weight loss of 8 pounds. He would like to continue to stay on medication and follow-up in 4 weeks.  Wt Readings from Last 3 Encounters:  09/05/20 233 lb 12.8 oz (106.1 kg)  08/08/20 241 lb (109.3 kg)  05/09/20 243 lb (110.2 kg)    Today patient denies signs and symptoms of COVID 19 infection including fever, chills, cough, shortness of breath, and headache. Past Medical, Surgical, Social History, Allergies, and Medications have been Reviewed.   Past Medical History:  Diagnosis Date  . Allergy   . Anemia   . Herniated lumbar intervertebral disc   . Obesity   . Sleep apnea     Current Meds  Medication Sig  . Cyanocobalamin (VITAMIN B 12 PO) Take 1 tablet by mouth daily.  Marland Kitchen Fe Bisgly-Vit C-Vit B12-FA (GENTLE IRON PO) Take 1 tablet by mouth daily.  Marcille Blanco 0.15-0.03 MG tablet Take 1 tablet by mouth daily.  Marland Kitchen levocetirizine (XYZAL) 5 MG tablet Take 1 tablet (5 mg total) by mouth every evening.  Marland Kitchen lisinopril (ZESTRIL) 10 MG tablet Take 1 tablet (10 mg total) by mouth daily.  .  phentermine (ADIPEX-P) 37.5 MG tablet Take 1 tablet (37.5 mg total) by mouth daily before breakfast.  . [DISCONTINUED] phentermine (ADIPEX-P) 37.5 MG tablet Take 0.5 tablets (18.75 mg total) by mouth daily before breakfast.    ROS:  Review of Systems  Constitutional: Negative.   HENT: Negative.   Eyes: Negative.   Respiratory: Negative.   Cardiovascular: Negative.   Gastrointestinal: Negative.   Genitourinary: Negative.   Musculoskeletal: Negative.   Skin: Negative.   Neurological: Negative.   Endo/Heme/Allergies: Negative.   Psychiatric/Behavioral: Negative.      Objective:   Today's Vitals: BP 124/76 (BP Location: Right Arm, Patient Position: Sitting, Cuff Size: Normal)   Pulse 99   Temp (!) 97.4 F (36.3 C) (Temporal)   Resp 18   Ht 5\' 3"  (1.6 m)   Wt 233 lb 12.8 oz (106.1 kg)   SpO2 99%   BMI 41.42 kg/m  Vitals with BMI 09/05/2020 08/08/2020 05/09/2020  Height 5\' 3"  5\' 3"  5\' 3"   Weight 233 lbs 13 oz 241 lbs 243 lbs  BMI 41.43 42.7 43.06  Systolic 124 118 05/11/2020  Diastolic 76 78 100  Pulse 99 83 87     Physical Exam Vitals and nursing note reviewed.  Constitutional:      Appearance: Normal appearance. She is well-developed and well-groomed. She is obese.  HENT:     Head: Normocephalic and atraumatic.     Right Ear: External ear normal.     Left Ear: External ear normal.     Mouth/Throat:     Comments: Mask in place  Eyes:     General:        Right eye: No discharge.        Left eye: No discharge.     Conjunctiva/sclera: Conjunctivae normal.  Cardiovascular:     Rate and Rhythm: Normal rate and regular rhythm.     Pulses: Normal pulses.     Heart sounds: Normal heart sounds.  Pulmonary:     Effort: Pulmonary effort is normal.     Breath sounds: Normal breath sounds.  Musculoskeletal:        General: Normal range of motion.     Cervical back: Normal range of motion and neck supple.  Skin:    General: Skin is warm.  Neurological:     General: No focal  deficit present.     Mental Status: She is alert and oriented to person, place, and time.  Psychiatric:        Attention and Perception: Attention normal.        Mood and Affect: Mood normal.        Speech: Speech normal.        Behavior: Behavior normal. Behavior is cooperative.        Thought Content: Thought content normal.        Cognition and Memory: Cognition normal.        Judgment: Judgment normal.     Assessment   1. Morbid obesity (HCC)     Tests ordered No orders of the defined types were placed in this encounter.    Plan: Please see assessment and plan per problem list above.   Meds ordered this encounter  Medications  . phentermine (ADIPEX-P) 37.5 MG tablet    Sig: Take 1 tablet (37.5 mg total) by mouth daily before breakfast.    Dispense:  30 tablet    Refill:  0    Order Specific Question:   Supervising Provider    Answer:   Kerri Perches [[id number]]    Patient to follow-up in 4 weeks  Freddy Finner, NP

## 2020-09-05 NOTE — Patient Instructions (Signed)
I appreciate the opportunity to provide you with care for your health and wellness. Today we discussed: weight loss  Follow up: 4 weeks for wt check in   No labs or referrals today  GREAT JOB ON THE WEIGHT LOSS!  KEEP MAKING THOSE SMALL CHANGES TO HELP DEVELOP GOOD HABITS :)  Please continue to practice social distancing to keep you, your family, and our community safe.  If you must go out, please wear a mask and practice good handwashing.  It was a pleasure to see you and I look forward to continuing to work together on your health and well-being. Please do not hesitate to call the office if you need care or have questions about your care.  Have a wonderful day and week. With Gratitude, Tereasa Coop, DNP, AGNP-BC

## 2020-09-09 DIAGNOSIS — R5383 Other fatigue: Secondary | ICD-10-CM | POA: Diagnosis not present

## 2020-09-09 DIAGNOSIS — G4733 Obstructive sleep apnea (adult) (pediatric): Secondary | ICD-10-CM | POA: Diagnosis not present

## 2020-09-09 DIAGNOSIS — I10 Essential (primary) hypertension: Secondary | ICD-10-CM | POA: Diagnosis not present

## 2020-09-11 DIAGNOSIS — Z634 Disappearance and death of family member: Secondary | ICD-10-CM | POA: Diagnosis not present

## 2020-09-23 ENCOUNTER — Other Ambulatory Visit (HOSPITAL_BASED_OUTPATIENT_CLINIC_OR_DEPARTMENT_OTHER): Payer: Self-pay

## 2020-09-23 DIAGNOSIS — G4733 Obstructive sleep apnea (adult) (pediatric): Secondary | ICD-10-CM

## 2020-09-25 DIAGNOSIS — Z634 Disappearance and death of family member: Secondary | ICD-10-CM | POA: Diagnosis not present

## 2020-09-29 ENCOUNTER — Other Ambulatory Visit: Payer: Self-pay

## 2020-09-29 ENCOUNTER — Ambulatory Visit: Payer: BC Managed Care – PPO | Attending: Neurology | Admitting: Neurology

## 2020-09-29 DIAGNOSIS — Z7901 Long term (current) use of anticoagulants: Secondary | ICD-10-CM | POA: Diagnosis not present

## 2020-09-29 DIAGNOSIS — R0683 Snoring: Secondary | ICD-10-CM | POA: Diagnosis not present

## 2020-09-29 DIAGNOSIS — G4733 Obstructive sleep apnea (adult) (pediatric): Secondary | ICD-10-CM

## 2020-09-29 DIAGNOSIS — Z79899 Other long term (current) drug therapy: Secondary | ICD-10-CM | POA: Insufficient documentation

## 2020-09-29 DIAGNOSIS — I493 Ventricular premature depolarization: Secondary | ICD-10-CM | POA: Diagnosis not present

## 2020-10-03 NOTE — Procedures (Signed)
   [facility] Shomari Matusik A. Gerilyn Pilgrim, MD     [email or web address]             NOCTURNAL POLYSOMNOGRAPHY   LOCATION: [facility]  Patient Name: Alisha Brock, Alisha Brock Date: 09/29/2020 Gender: Female D.O.B: 1976-02-16 Age (years): 89 Referring Provider: Beryle Beams MD, ABSM Height (inches): 63 Interpreting Physician: Beryle Beams MD, ABSM Weight (lbs): 225 RPSGT: Alfonso Ellis BMI: 40 MRN: [id number] Neck Size: 15.00 CLINICAL INFORMATION Sleep Study Type: NPSG     Indication for sleep study: N/A     Epworth Sleepiness Score: 6     SLEEP STUDY TECHNIQUE As per the AASM Manual for the Scoring of Sleep and Associated Events v2.3 (April 2016) with a hypopnea requiring 4% desaturations.  The channels recorded and monitored were frontal, central and occipital EEG, electrooculogram (EOG), submentalis EMG (chin), nasal and oral airflow, thoracic and abdominal wall motion, anterior tibialis EMG, snore microphone, electrocardiogram, and pulse oximetry.  MEDICATIONS Medications self-administered by patient taken the night of the study : N/A  Current Outpatient Medications:  .  Cyanocobalamin (VITAMIN B 12 PO), Take 1 tablet by mouth daily., Disp: , Rfl:  .  Fe Bisgly-Vit C-Vit B12-FA (GENTLE IRON PO), Take 1 tablet by mouth daily., Disp: , Rfl:  .  JOLESSA 0.15-0.03 MG tablet, Take 1 tablet by mouth daily., Disp: , Rfl:  .  levocetirizine (XYZAL) 5 MG tablet, Take 1 tablet (5 mg total) by mouth every evening., Disp: 30 tablet, Rfl: 1 .  lisinopril (ZESTRIL) 10 MG tablet, Take 1 tablet (10 mg total) by mouth daily., Disp: 90 tablet, Rfl: 0 .  phentermine (ADIPEX-P) 37.5 MG tablet, Take 1 tablet (37.5 mg total) by mouth daily before breakfast., Disp: 30 tablet, Rfl: 0     SLEEP ARCHITECTURE The study was initiated at 10:56:33 PM and ended at 5:34:03 AM.  Sleep onset time was 44.2 minutes and the sleep efficiency was 77.3%%. The total sleep time was 307.3  minutes.  Stage REM latency was 141.5 minutes.  The patient spent 14.2%% of the night in stage N1 sleep, 57.3%% in stage N2 sleep, 17.0%% in stage N3 and 11.6% in REM.  Alpha intrusion was absent.  Supine sleep was 0.00%.  RESPIRATORY PARAMETERS The overall apnea/hypopnea index (AHI) was 1.8 per hour. There were 0 total apneas, including 0 obstructive, 0 central and 0 mixed apneas. There were 9 hypopneas and 1 RERAs.  The AHI during Stage REM sleep was 3.4 per hour.  AHI while supine was N/A per hour.  The mean oxygen saturation was 95.9%. The minimum SpO2 during sleep was 83.0%.  moderate snoring was noted during this study.  CARDIAC DATA The 2 lead EKG demonstrated sinus rhythm. The mean heart rate was 44.8 beats per minute. Other EKG findings include: PVCs.  LEG MOVEMENT DATA The total PLMS were 0 with a resulting PLMS index of 0.0. Associated arousal with leg movement index was 1.0.  IMPRESSIONS 1. This study showed fragmented sleep with frequent arousal but without any underlying sleep disorders such as sleep apnea syndrome.   Argie Ramming, MD Diplomate, American Board of Sleep Medicine.  ELECTRONICALLY SIGNED ON:  10/03/2020, 3:46 PM [facility] PH: [phone]   FX: [phone] ACCREDITED BY THE AMERICAN ACADEMY OF SLEEP MEDICINE

## 2020-10-09 DIAGNOSIS — Z634 Disappearance and death of family member: Secondary | ICD-10-CM | POA: Diagnosis not present

## 2020-10-10 ENCOUNTER — Ambulatory Visit (INDEPENDENT_AMBULATORY_CARE_PROVIDER_SITE_OTHER): Payer: BC Managed Care – PPO | Admitting: Family Medicine

## 2020-10-10 ENCOUNTER — Other Ambulatory Visit: Payer: Self-pay

## 2020-10-10 ENCOUNTER — Encounter: Payer: Self-pay | Admitting: Family Medicine

## 2020-10-10 MED ORDER — PHENTERMINE HCL 37.5 MG PO TABS: 37.5000 mg | ORAL_TABLET | Freq: Every day | ORAL | 0 refills | Status: DC

## 2020-10-10 NOTE — Assessment & Plan Note (Signed)
Improved Obesity is linked to HTN and Depressio  Alisha Brock is re-educated ad encouraged about the importance of exercise daily to help with weight management. A minumum of 30 minutes daily is recommended. Additionally, importance of healthy food choices and drinking water    Wt Readings from Last 3 Encounters:  10/10/20 227 lb (103 kg)  09/05/20 233 lb 12.8 oz (106.1 kg)  08/08/20 241 lb (109.3 kg)

## 2020-10-10 NOTE — Patient Instructions (Signed)
  HAPPY FALL!  I appreciate the opportunity to provide you with care for your health and wellness. Today we discussed: weight loss  Follow up: 1 month  No labs or referrals today  CONGRATS ON THE WEIGHT LOSS [place] 7! KEEP GOING AND TRY TO GET SOME EXERCISE STARTED UP :)  Please continue to practice social distancing to keep you, your family, and our community safe.  If you must go out, please wear a mask and practice good handwashing.  It was a pleasure to see you and I look forward to continuing to work together on your health and well-being. Please do not hesitate to call the office if you need care or have questions about your care.  Have a wonderful day and week. With Gratitude, Tereasa Coop, DNP, AGNP-BC

## 2020-10-10 NOTE — Progress Notes (Signed)
Subjective:  Patient ID: Alisha Brock, female    DOB: 12-26-1975  Age: 44 y.o. MRN: [id number]  CC:  Chief Complaint  Patient presents with  . Follow-up    weight check      HPI  HPI  Alisha Brock is here for follow up regarding weight. Reports she is following a low fat heart healthy diet. Not exercising at this time, but is wanting to get started.  Reports drinking water daily, but struggles to get adequate amount. Is having constipation increased currently as well. Reports sleeping daily.  She denies having any headaches, chest pain, blurred vision, dizziness, rapid heart rate or any other signs or symptoms of intolerance of medication.  Blood pressure is well controlled.  Does have a demonstrated weight loss of 7 pounds. For a total of 15 pounds since starting the medication. She would like to continue to stay on medication and follow-up in 4 weeks. Goal weight is 200 or lower. Would like to reach this by the end of the year.   Today patient denies signs and symptoms of COVID 19 infection including fever, chills, cough, shortness of breath, and headache. Past Medical, Surgical, Social History, Allergies, and Medications have been Reviewed.   Past Medical History:  Diagnosis Date  . Allergy   . Anemia   . Herniated lumbar intervertebral disc   . Obesity   . Sleep apnea     Current Meds  Medication Sig  . Cyanocobalamin (VITAMIN B 12 PO) Take 1 tablet by mouth daily.  Marland Kitchen Fe Bisgly-Vit C-Vit B12-FA (GENTLE IRON PO) Take 1 tablet by mouth daily.  Marcille Blanco 0.15-0.03 MG tablet Take 1 tablet by mouth daily.  Marland Kitchen levocetirizine (XYZAL) 5 MG tablet Take 1 tablet (5 mg total) by mouth every evening.  Marland Kitchen lisinopril (ZESTRIL) 10 MG tablet Take 1 tablet (10 mg total) by mouth daily.  . phentermine (ADIPEX-P) 37.5 MG tablet Take 1 tablet (37.5 mg total) by mouth daily before breakfast.  . [DISCONTINUED] phentermine (ADIPEX-P) 37.5 MG tablet Take 1 tablet (37.5  mg total) by mouth daily before breakfast.    ROS:  Review of Systems  Constitutional: Negative.   HENT: Negative.   Eyes: Negative.   Respiratory: Negative.   Cardiovascular: Negative.   Gastrointestinal: Negative.   Genitourinary: Negative.   Musculoskeletal: Negative.   Skin: Negative.   Neurological: Negative.   Endo/Heme/Allergies: Negative.   Psychiatric/Behavioral: Negative.      Objective:   Today's Vitals: BP 126/70 (BP Location: Left Arm, Patient Position: Sitting, Cuff Size: Large)   Pulse (!) 102   Temp 97.8 F (36.6 C) (Tympanic)   Ht 5\' 3"  (1.6 m)   Wt 227 lb (103 kg)   LMP 08/13/2020   SpO2 98%   BMI 40.21 kg/m  Vitals with BMI 10/10/2020 09/05/2020 08/08/2020  Height 5\' 3"  5\' 3"  5\' 3"   Weight 227 lbs 233 lbs 13 oz 241 lbs  BMI 40.22 41.43 42.7  Systolic 126 124 08/10/2020  Diastolic 70 76 78  Pulse 102 99 83     Physical Exam Vitals and nursing note reviewed.  Constitutional:      Appearance: Normal appearance. She is well-developed and well-groomed. She is obese.  HENT:     Head: Normocephalic and atraumatic.     Right Ear: External ear normal.     Left Ear: External ear normal.     Mouth/Throat:     Comments: Mask in place  Eyes:  General:        Right eye: No discharge.        Left eye: No discharge.     Conjunctiva/sclera: Conjunctivae normal.  Cardiovascular:     Rate and Rhythm: Normal rate and regular rhythm.     Pulses: Normal pulses.     Heart sounds: Normal heart sounds.  Pulmonary:     Effort: Pulmonary effort is normal.     Breath sounds: Normal breath sounds.  Musculoskeletal:        General: Normal range of motion.     Cervical back: Normal range of motion and neck supple.  Skin:    General: Skin is warm.  Neurological:     General: No focal deficit present.     Mental Status: She is alert and oriented to person, place, and time.  Psychiatric:        Attention and Perception: Attention normal.        Mood and Affect:  Mood normal.        Speech: Speech normal.        Behavior: Behavior normal. Behavior is cooperative.        Thought Content: Thought content normal.        Cognition and Memory: Cognition normal.        Judgment: Judgment normal.     Assessment   1. Morbid obesity (HCC)     Tests ordered No orders of the defined types were placed in this encounter.    Plan: Please see assessment and plan per problem list above.   Meds ordered this encounter  Medications  . phentermine (ADIPEX-P) 37.5 MG tablet    Sig: Take 1 tablet (37.5 mg total) by mouth daily before breakfast.    Dispense:  30 tablet    Refill:  0    Order Specific Question:   Supervising Provider    Answer:   Kerri Perches [[id number]]    Patient to follow-up in 1 month.  Note: This dictation was prepared with Dragon dictation along with smaller phrase technology. Similar sounding words can be transcribed inadequately or may not be corrected upon review. Any transcriptional errors that result from this process are unintentional.      Freddy Finner, NP

## 2020-10-23 DIAGNOSIS — Z634 Disappearance and death of family member: Secondary | ICD-10-CM | POA: Diagnosis not present

## 2020-10-26 ENCOUNTER — Encounter: Payer: Self-pay | Admitting: Family Medicine

## 2020-10-28 ENCOUNTER — Other Ambulatory Visit: Payer: Self-pay

## 2020-10-28 DIAGNOSIS — J3089 Other allergic rhinitis: Secondary | ICD-10-CM

## 2020-10-28 MED ORDER — LEVOCETIRIZINE DIHYDROCHLORIDE 5 MG PO TABS: 5.0000 mg | ORAL_TABLET | Freq: Every evening | ORAL | 1 refills | Status: DC

## 2020-10-29 ENCOUNTER — Other Ambulatory Visit: Payer: Self-pay

## 2020-10-29 DIAGNOSIS — J3089 Other allergic rhinitis: Secondary | ICD-10-CM

## 2020-10-29 MED ORDER — LEVOCETIRIZINE DIHYDROCHLORIDE 5 MG PO TABS: 5.0000 mg | ORAL_TABLET | Freq: Every evening | ORAL | 1 refills | Status: DC

## 2020-10-31 DIAGNOSIS — H5213 Myopia, bilateral: Secondary | ICD-10-CM | POA: Diagnosis not present

## 2020-11-04 DIAGNOSIS — R5383 Other fatigue: Secondary | ICD-10-CM | POA: Diagnosis not present

## 2020-11-04 DIAGNOSIS — I10 Essential (primary) hypertension: Secondary | ICD-10-CM | POA: Diagnosis not present

## 2020-11-05 ENCOUNTER — Other Ambulatory Visit: Payer: Self-pay

## 2020-11-05 ENCOUNTER — Ambulatory Visit (INDEPENDENT_AMBULATORY_CARE_PROVIDER_SITE_OTHER): Payer: BC Managed Care – PPO | Admitting: Family Medicine

## 2020-11-05 ENCOUNTER — Encounter: Payer: Self-pay | Admitting: Family Medicine

## 2020-11-05 VITALS — BP 124/80 | HR 88 | Temp 97.8°F | Ht 63.0 in | Wt 224.0 lb

## 2020-11-05 DIAGNOSIS — I10 Essential (primary) hypertension: Secondary | ICD-10-CM

## 2020-11-05 MED ORDER — PHENTERMINE HCL 37.5 MG PO TABS: 37.5000 mg | ORAL_TABLET | Freq: Every day | ORAL | 0 refills | Status: DC

## 2020-11-05 NOTE — Progress Notes (Signed)
Subjective:  Patient ID: Alisha Brock, female    DOB: 1976/06/29  Age: 44 y.o. MRN: [id number]  CC:  Chief Complaint  Patient presents with  . Follow-up    b/p and weight      HPI  HPI   Alisha Cookey Satterfieldis here for follow up regarding weight. Reports she isfollowing a low fat heart healthy diet. Not exercising at this time, but is wanting to get started. Reports drinking water daily, but struggles to getadequate amount. Reports sleeping daily. She denies having any headaches, chest pain, blurred vision, dizziness, rapid heart rate or any other signs or symptoms of intolerance of medication. Blood pressure is well controlled.Does have a demonstrated weight loss of 3 pounds this last 3.5 weeks. She reports increase in family stress which caused her to not make the best food choices or focus on her health. For a total of 18 pounds since starting the medication. She would like to continue to stay on medication and follow-up in 4 weeks. Goal weight is 200 or lower. Would like to reach this by the end of the year.   Wt Readings from Last 3 Encounters:  11/05/20 224 lb (101.6 kg)  10/10/20 227 lb (103 kg)  09/05/20 233 lb 12.8 oz (106.1 kg)   Today patient denies signs and symptoms of COVID 19 infection including fever, chills, cough, shortness of breath, and headache. Past Medical, Surgical, Social History, Allergies, and Medications have been Reviewed.   Past Medical History:  Diagnosis Date  . Allergy   . Anemia   . Herniated lumbar intervertebral disc   . Obesity   . Sleep apnea     Current Meds  Medication Sig  . Cyanocobalamin (VITAMIN B 12 PO) Take 1 tablet by mouth daily.  Marland Kitchen Fe Bisgly-Vit C-Vit B12-FA (GENTLE IRON PO) Take 1 tablet by mouth daily.  Marcille Blanco 0.15-0.03 MG tablet Take 1 tablet by mouth daily.  Marland Kitchen levocetirizine (XYZAL) 5 MG tablet Take 1 tablet (5 mg total) by mouth every evening.  Marland Kitchen lisinopril (ZESTRIL) 10 MG tablet Take 1  tablet (10 mg total) by mouth daily.  . phentermine (ADIPEX-P) 37.5 MG tablet Take 1 tablet (37.5 mg total) by mouth daily before breakfast.  . [DISCONTINUED] phentermine (ADIPEX-P) 37.5 MG tablet Take 1 tablet (37.5 mg total) by mouth daily before breakfast.    ROS:  Review of Systems  HENT: Negative.   Eyes: Negative.   Respiratory: Negative.   Cardiovascular: Negative.   Gastrointestinal: Negative.   Genitourinary: Negative.   Musculoskeletal: Negative.   Skin: Negative.   Neurological: Negative.   Endo/Heme/Allergies: Negative.   Psychiatric/Behavioral: Negative.      Objective:   Today's Vitals: BP 124/80 (BP Location: Left Arm, Patient Position: Sitting, Cuff Size: Normal)   Pulse 88   Temp 97.8 F (36.6 C) (Temporal)   Ht 5\' 3"  (1.6 m)   Wt 224 lb (101.6 kg)   LMP 10/30/2020   SpO2 100%   BMI 39.68 kg/m  Vitals with BMI 11/05/2020 10/10/2020 09/05/2020  Height 5\' 3"  5\' 3"  5\' 3"   Weight 224 lbs 227 lbs 233 lbs 13 oz  BMI 39.69 40.22 41.43  Systolic 124 126 09/07/2020  Diastolic 80 70 76  Pulse 88 102 99     Physical Exam Vitals and nursing note reviewed.  Constitutional:      Appearance: Normal appearance. She is well-developed and well-groomed. She is obese.  HENT:     Head: Normocephalic and atraumatic.  Right Ear: External ear normal.     Left Ear: External ear normal.     Mouth/Throat:     Comments: Mask in place  Eyes:     General:        Right eye: No discharge.        Left eye: No discharge.     Conjunctiva/sclera: Conjunctivae normal.  Cardiovascular:     Rate and Rhythm: Normal rate and regular rhythm.     Pulses: Normal pulses.     Heart sounds: Normal heart sounds.  Pulmonary:     Effort: Pulmonary effort is normal.     Breath sounds: Normal breath sounds.  Musculoskeletal:        General: Normal range of motion.     Cervical back: Normal range of motion and neck supple.  Skin:    General: Skin is warm.  Neurological:     General: No  focal deficit present.     Mental Status: She is alert and oriented to person, place, and time.  Psychiatric:        Attention and Perception: Attention normal.        Mood and Affect: Mood normal.        Speech: Speech normal.        Behavior: Behavior normal. Behavior is cooperative.        Thought Content: Thought content normal.        Cognition and Memory: Cognition normal.        Judgment: Judgment normal.     Assessment   1. Essential hypertension   2. Morbid obesity (HCC)     Tests ordered No orders of the defined types were placed in this encounter.    Plan: Please see assessment and plan per problem list above.   Meds ordered this encounter  Medications  . phentermine (ADIPEX-P) 37.5 MG tablet    Sig: Take 1 tablet (37.5 mg total) by mouth daily before breakfast.    Dispense:  30 tablet    Refill:  0    Order Specific Question:   Supervising Provider    Answer:   Kerri Perches [[id number]]    Patient to follow-up in 4 weeks  Note: This dictation was prepared with Dragon dictation along with smaller phrase technology. Similar sounding words can be transcribed inadequately or may not be corrected upon review. Any transcriptional errors that result from this process are unintentional.      Freddy Finner, NP

## 2020-11-05 NOTE — Patient Instructions (Addendum)
°  HAPPY FALL!  I appreciate the opportunity to provide you with care for your health and wellness. Today we discussed: wt loss   Follow up: 4 weeks for wt and BP check   No labs or referrals today  So glad your cousin is ok.  I hope you have a good Thanksgiving!  Please continue to practice social distancing to keep you, your family, and our community safe.  If you must go out, please wear a mask and practice good handwashing.  It was a pleasure to see you and I look forward to continuing to work together on your health and well-being. Please do not hesitate to call the office if you need care or have questions about your care.  Have a wonderful day and week. With Gratitude, Tereasa Coop, DNP, AGNP-BC

## 2020-11-05 NOTE — Assessment & Plan Note (Signed)
Holding well on Adipex, continue linsinopril at this time Dash diet and exercise encouraged.

## 2020-11-06 DIAGNOSIS — Z634 Disappearance and death of family member: Secondary | ICD-10-CM | POA: Diagnosis not present

## 2020-11-11 DIAGNOSIS — H0102A Squamous blepharitis right eye, upper and lower eyelids: Secondary | ICD-10-CM | POA: Diagnosis not present

## 2020-11-11 DIAGNOSIS — H04123 Dry eye syndrome of bilateral lacrimal glands: Secondary | ICD-10-CM | POA: Diagnosis not present

## 2020-11-11 DIAGNOSIS — H0102B Squamous blepharitis left eye, upper and lower eyelids: Secondary | ICD-10-CM | POA: Diagnosis not present

## 2020-11-11 DIAGNOSIS — H00025 Hordeolum internum left lower eyelid: Secondary | ICD-10-CM | POA: Diagnosis not present

## 2020-11-20 DIAGNOSIS — Z634 Disappearance and death of family member: Secondary | ICD-10-CM | POA: Diagnosis not present

## 2020-12-03 ENCOUNTER — Other Ambulatory Visit: Payer: Self-pay

## 2020-12-03 ENCOUNTER — Ambulatory Visit: Payer: BC Managed Care – PPO | Admitting: Family Medicine

## 2020-12-03 ENCOUNTER — Encounter: Payer: Self-pay | Admitting: Family Medicine

## 2020-12-03 DIAGNOSIS — I10 Essential (primary) hypertension: Secondary | ICD-10-CM

## 2020-12-03 MED ORDER — LISINOPRIL 10 MG PO TABS: 10.0000 mg | ORAL_TABLET | Freq: Every day | ORAL | 0 refills | Status: DC

## 2020-12-03 MED ORDER — PHENTERMINE HCL 37.5 MG PO TABS: 37.5000 mg | ORAL_TABLET | Freq: Every day | ORAL | 0 refills | Status: DC

## 2020-12-03 NOTE — Progress Notes (Signed)
Subjective:  Patient ID: Alisha Brock, female    DOB: [birth year]  Age: 44 y.o. MRN: [id number]  CC:  Chief Complaint  Patient presents with   Hypertension    F/u   Weight Check    F/u      HPI  HPI  Alisha Cookey Satterfieldis here for follow up regarding weight. Reportsshe isfollowing a low fat heart healthy diet.Not exercising at this time, but is wanting to get started. Reports drinking water daily, but struggles to getadequate amount.Reports sleeping daily. She denies having any headaches, chest pain, blurred vision, dizziness, rapid heart rate or any other signs or symptoms of intolerance of medication. Blood pressure is well controlled.Does have a demonstrated weight loss of4pounds this last 3.5 weeks. She reports decrease in family stress which has been helpful. For a total of 22 pounds since starting the medication. She would like to continue to stay on medication and follow-up in 4 weeks. Goal weight is 200 or lower. Would like to reach this by the end of the year.  Wt Readings from Last 3 Encounters:  12/03/20 220 lb (99.8 kg)  11/05/20 224 lb (101.6 kg)  10/10/20 227 lb (103 kg)   Today patient denies signs and symptoms of COVID 19 infection including fever, chills, cough, shortness of breath, and headache. Past Medical, Surgical, Social History, Allergies, and Medications have been Reviewed.   Past Medical History:  Diagnosis Date   Allergy    Anemia    Herniated lumbar intervertebral disc    Obesity    Sleep apnea     Current Meds  Medication Sig   Cyanocobalamin (VITAMIN B 12 PO) Take 1 tablet by mouth daily.   Fe Bisgly-Vit C-Vit B12-FA (GENTLE IRON PO) Take 1 tablet by mouth daily.   JOLESSA 0.15-0.03 MG tablet Take 1 tablet by mouth daily.   levocetirizine (XYZAL) 5 MG tablet Take 1 tablet (5 mg total) by mouth every evening.   lisinopril (ZESTRIL) 10 MG tablet Take 1 tablet (10 mg total) by mouth daily.    [DISCONTINUED] phentermine (ADIPEX-P) 37.5 MG tablet Take 1 tablet (37.5 mg total) by mouth daily before breakfast.    ROS:  Review of Systems  Constitutional: Negative.   HENT: Negative.   Eyes: Negative.   Respiratory: Negative.   Cardiovascular: Negative.   Gastrointestinal: Negative.   Genitourinary: Negative.   Musculoskeletal: Negative.   Skin: Negative.   Neurological: Negative.   Endo/Heme/Allergies: Negative.   Psychiatric/Behavioral: Negative.      Objective:   Today's Vitals: BP 128/78 (BP Location: Right Arm, Patient Position: Sitting, Cuff Size: Normal)    Pulse 92    Temp 98 F (36.7 C) (Temporal)    Ht 5\' 3"  (1.6 m)    Wt 220 lb (99.8 kg)    LMP 10/30/2020    SpO2 98%    BMI 38.97 kg/m  Vitals with BMI 12/03/2020 11/05/2020 10/10/2020  Height 5\' 3"  5\' 3"  5\' 3"   Weight 220 lbs 224 lbs 227 lbs  BMI 38.98 39.69 40.22  Systolic 128 124 10/12/2020  Diastolic 78 80 70  Pulse 92 88 102     Physical Exam Vitals and nursing note reviewed.  Constitutional:      Appearance: Normal appearance. She is well-developed and well-groomed. She is morbidly obese.  HENT:     Head: Normocephalic and atraumatic.     Right Ear: External ear normal.     Left Ear: External ear normal.  Mouth/Throat:     Comments: Mask in place  Eyes:     General:        Right eye: No discharge.        Left eye: No discharge.     Conjunctiva/sclera: Conjunctivae normal.  Cardiovascular:     Rate and Rhythm: Normal rate and regular rhythm.     Pulses: Normal pulses.     Heart sounds: Normal heart sounds.  Pulmonary:     Effort: Pulmonary effort is normal.     Breath sounds: Normal breath sounds.  Musculoskeletal:        General: Normal range of motion.     Cervical back: Normal range of motion and neck supple.  Skin:    General: Skin is warm.  Neurological:     General: No focal deficit present.     Mental Status: She is alert and oriented to person, place, and time.  Psychiatric:         Attention and Perception: Attention normal.        Mood and Affect: Mood normal.        Speech: Speech normal.        Behavior: Behavior normal. Behavior is cooperative.        Thought Content: Thought content normal.        Cognition and Memory: Cognition normal.        Judgment: Judgment normal.     Assessment   1. Morbid obesity (HCC)   2. Essential hypertension     Tests ordered No orders of the defined types were placed in this encounter.    Plan: Please see assessment and plan per problem list above.   Meds ordered this encounter  Medications   phentermine (ADIPEX-P) 37.5 MG tablet    Sig: Take 1 tablet (37.5 mg total) by mouth daily before breakfast.    Dispense:  30 tablet    Refill:  0    Order Specific Question:   Supervising Provider    Answer:   Genia Harold    Patient to follow-up in 01/02/2021   Freddy Finner, NP

## 2020-12-03 NOTE — Patient Instructions (Signed)
  I appreciate the opportunity to provide you with care for your health and wellness. Today we discussed: wt   Follow up: 4 weeks for BP and wt   No labs or referrals today  Congrats on continued loss! Keep going!  Safe travels and [place] and Happy New Year :)  Please continue to practice social distancing to keep you, your family, and our community safe.  If you must go out, please wear a mask and practice good handwashing.  It was a pleasure to see you and I look forward to continuing to work together on your health and well-being. Please do not hesitate to call the office if you need care or have questions about your care.  Have a wonderful day. With Gratitude, Tereasa Coop, DNP, AGNP-BC

## 2020-12-03 NOTE — Assessment & Plan Note (Signed)
Continue lisinopril  DASH diet and exercise encouraged

## 2021-01-02 ENCOUNTER — Encounter: Payer: Self-pay | Admitting: Family Medicine

## 2021-01-02 ENCOUNTER — Other Ambulatory Visit: Payer: Self-pay

## 2021-01-02 ENCOUNTER — Telehealth (INDEPENDENT_AMBULATORY_CARE_PROVIDER_SITE_OTHER): Payer: BC Managed Care – PPO | Admitting: Family Medicine

## 2021-01-02 DIAGNOSIS — I10 Essential (primary) hypertension: Secondary | ICD-10-CM

## 2021-01-02 MED ORDER — PHENTERMINE HCL 37.5 MG PO TABS: 37.5000 mg | ORAL_TABLET | Freq: Every day | ORAL | 0 refills | Status: DC

## 2021-01-02 NOTE — Progress Notes (Signed)
Virtual Visit via Telephone Note   This visit type was conducted due to national recommendations for restrictions regarding the COVID-19 Pandemic (e.g. social distancing) in an effort to limit this patient's exposure and mitigate transmission in our community.  Due to her co-morbid illnesses, this patient is at least at moderate risk for complications without adequate follow up.  This format is felt to be most appropriate for this patient at this time.  The patient did not have access to video technology/had technical difficulties with video requiring transitioning to audio format only (telephone).  All issues noted in this document were discussed and addressed.  No physical exam could be performed with this format.  PleaEvaluation Performed:  Follow-up visit  Date:  01/02/2021   ID:  Alisha, Brock 1976-09-15, MRN [id number]  Patient Location: Home Provider Location: Office/Clinic   Participants: Nurse/CMA for intake and work up; Patient and Provider for Visit and Wrap up  Method of visit: Telephone   Location of Patient: Home Location of Provider: Office Consent was obtain for visit over the telephone. Services rendered by provider: Visit was performed via telephone   I verified that I am speaking with the correct person using two identifiers.  PCP:  Freddy Finner, NP   Chief Complaint:  Wt and BP f/u  History of Present Illness:    Alisha Brock is a 45 y.o. female history as stated below, presents today for weight check.  Reports another 2 pound weight loss.  Bringing her to a total of 24 pounds since starting the medication.  She would like to continue this for another 4 months.  She had been on vacation and out of holidays so she reports that her weight loss could have been a little bit better in the last month but she had changed her eating habits.  She reports that she has signed up for a personal trainer 3 days a week additionally doing a HIT  class on the weekends.  Her goal weight is still 200 or lower.  She is thinking that she could reach this by June and wants to continue to do steady slow progress with lifestyle changes as well as medication use at least for another month.  She denies having any intolerance to the medication.  And blood pressure is well controlled.  She is not having any headaches, chest pain, blurred vision, dizziness, rapid heart rate or any other signs or symptoms of uncontrolled blood pressure or medication intolerance.  The patient does not have symptoms concerning for COVID-19 infection (fever, chills, cough, or new shortness of breath).   Past Medical, Surgical, Social History, Allergies, and Medications have been Reviewed.  Past Medical History:  Diagnosis Date  . Allergy   . Anemia   . Herniated lumbar intervertebral disc   . Obesity   . Sleep apnea    Past Surgical History:  Procedure Laterality Date  . BARIATRIC SURGERY  11/2015  . CESAREAN SECTION  August 2004  . DILATION AND EVACUATION N/A 08/12/2018   Procedure: DILATATION AND EVACUATION;  Surgeon: Carrington Clamp, MD;  Location: [facility];  Service: Gynecology;  Laterality: N/A;  . DILATION AND EVACUATION N/A 02/10/2019   Procedure: DILATATION AND EVACUATION;  Surgeon: Carrington Clamp, MD;  Location: [facility];  Service: Gynecology;  Laterality: N/A;  . HERNIA REPAIR Bilateral    Inguinal Hernia Repair at age 59.  . TONSILLECTOMY    . WISDOM TOOTH EXTRACTION  2010  Current Meds  Medication Sig  . Cyanocobalamin (VITAMIN B 12 PO) Take 1 tablet by mouth daily.  Marland Kitchen Fe Bisgly-Vit C-Vit B12-FA (GENTLE IRON PO) Take 1 tablet by mouth daily.  Marcille Blanco 0.15-0.03 MG tablet Take 1 tablet by mouth daily.  Marland Kitchen levocetirizine (XYZAL) 5 MG tablet Take 1 tablet (5 mg total) by mouth every evening.  Marland Kitchen lisinopril (ZESTRIL) 10 MG tablet Take 1 tablet (10 mg total) by mouth daily.  . phentermine (ADIPEX-P) 37.5 MG tablet Take 1 tablet  (37.5 mg total) by mouth daily before breakfast.     Allergies:   Patient has no allergy information on record.   ROS:   Please see the history of present illness.    All other systems reviewed and are negative.   Labs/Other Tests and Data Reviewed:    Recent Labs: 05/09/2020: ALT 7; BUN 6; Creat 0.77; Hemoglobin 13.3; Platelets 383; Potassium 4.3; Sodium 138; TSH 1.31   Recent Lipid Panel Lab Results  Component Value Date/Time   CHOL 157 05/09/2020 09:23 AM   TRIG 60 05/09/2020 09:23 AM   HDL 46 (L) 05/09/2020 09:23 AM   CHOLHDL 3.4 05/09/2020 09:23 AM   LDLCALC 96 05/09/2020 09:23 AM    Wt Readings from Last 3 Encounters:  01/02/21 218 lb (98.9 kg)  12/03/20 220 lb (99.8 kg)  11/05/20 224 lb (101.6 kg)     Objective:    Vital Signs:  BP 120/81   Ht 5\' 3"  (1.6 m)   Wt 218 lb (98.9 kg)   LMP 10/30/2020   BMI 38.62 kg/m    VITAL SIGNS:  reviewed GEN:  no acute distress RESPIRATORY:  no shortness of breath in conversation PSYCH:  normal affect  ASSESSMENT & PLAN:    1. Morbid obesity (HCC) - phentermine (ADIPEX-P) 37.5 MG tablet; Take 1 tablet (37.5 mg total) by mouth daily before breakfast.  Dispense: 30 tablet; Refill: 0  2. Essential hypertension    Time:   Today, I have spent 7 minutes with the patient with telehealth technology discussing the above problems.     Medication Adjustments/Labs and Tests Ordered: Current medicines are reviewed at length with the patient today.  Concerns regarding medicines are outlined above.   Tests Ordered: No orders of the defined types were placed in this encounter.   Medication Changes: No orders of the defined types were placed in this encounter.    Disposition:  Follow up 4 weeks  Signed, 13/09/2020, NP  01/02/2021 4:01 PM     01/04/2021 [facility]

## 2021-01-02 NOTE — Patient Instructions (Signed)
  I appreciate the opportunity to provide you with care for your health and wellness.  Follow up: 4 weeks for wt and BP check  No labs or referrals today  Congratulations on weight loss! 24 pounds!!!  Keep up the good work with the lifestyle changes you have made.  Please continue to practice social distancing to keep you, your family, and our community safe.  If you must go out, please wear a mask and practice good handwashing.  It was a pleasure to see you and I look forward to continuing to work together on your health and well-being. Please do not hesitate to call the office if you need care or have questions about your care.  Have a wonderful day. With Gratitude, Tereasa Coop, DNP, AGNP-BC

## 2021-01-02 NOTE — Assessment & Plan Note (Signed)
Controlled, continue ACE DASH and exercise encouraged

## 2021-01-02 NOTE — Assessment & Plan Note (Signed)
Overall doing well.  Has had another 2 pound weight loss.  Bringing her total to 24 pounds.  We will continue 1 more month of phentermine.  Advised to make sure that she is making good lifestyle changes which include diet and exercise.  We will follow-up in 4 weeks in the office for blood pressure and weight check.  Unchanged, Deteriorated, Improved  Alisha Brock is re-educated about the importance of exercise daily to help with weight management. A minumum of 30 minutes daily is recommended. Additionally, importance of healthy food choices  with portion control discussed. Encouraged to start a food diary, count calories and to consider joining a  support group if possible. Diet information sheets offered.  Wt Readings from Last 3 Encounters:  01/02/21 218 lb (98.9 kg)  12/03/20 220 lb (99.8 kg)  11/05/20 224 lb (101.6 kg)

## 2021-01-30 ENCOUNTER — Ambulatory Visit: Payer: BC Managed Care – PPO | Admitting: Family Medicine

## 2021-03-26 DIAGNOSIS — L719 Rosacea, unspecified: Secondary | ICD-10-CM | POA: Diagnosis not present

## 2021-04-01 DIAGNOSIS — Z124 Encounter for screening for malignant neoplasm of cervix: Secondary | ICD-10-CM | POA: Diagnosis not present

## 2021-04-01 DIAGNOSIS — Z01419 Encounter for gynecological examination (general) (routine) without abnormal findings: Secondary | ICD-10-CM | POA: Diagnosis not present

## 2021-04-01 DIAGNOSIS — Z6841 Body Mass Index (BMI) 40.0 and over, adult: Secondary | ICD-10-CM | POA: Diagnosis not present

## 2021-04-01 DIAGNOSIS — Z1231 Encounter for screening mammogram for malignant neoplasm of breast: Secondary | ICD-10-CM | POA: Diagnosis not present

## 2021-04-02 ENCOUNTER — Other Ambulatory Visit: Payer: Self-pay | Admitting: Obstetrics and Gynecology

## 2021-04-02 DIAGNOSIS — R928 Other abnormal and inconclusive findings on diagnostic imaging of breast: Secondary | ICD-10-CM

## 2021-04-24 ENCOUNTER — Other Ambulatory Visit: Payer: Self-pay

## 2021-04-24 ENCOUNTER — Ambulatory Visit: Payer: BC Managed Care – PPO

## 2021-04-24 ENCOUNTER — Ambulatory Visit
Admission: RE | Admit: 2021-04-24 | Discharge: 2021-04-24 | Disposition: A | Discharge status: Home / Self Care | Payer: BC Managed Care – PPO | Source: Ambulatory Visit | Attending: Obstetrics and Gynecology | Admitting: Obstetrics and Gynecology

## 2021-04-24 DIAGNOSIS — R928 Other abnormal and inconclusive findings on diagnostic imaging of breast: Secondary | ICD-10-CM | POA: Diagnosis not present

## 2021-04-27 ENCOUNTER — Other Ambulatory Visit: Payer: Self-pay | Admitting: Family Medicine

## 2021-04-27 DIAGNOSIS — J3089 Other allergic rhinitis: Secondary | ICD-10-CM

## 2021-04-28 ENCOUNTER — Other Ambulatory Visit: Payer: Self-pay | Admitting: Family Medicine

## 2021-04-28 DIAGNOSIS — I10 Essential (primary) hypertension: Secondary | ICD-10-CM

## 2021-07-22 ENCOUNTER — Encounter: Payer: Self-pay | Admitting: Nurse Practitioner

## 2021-07-22 ENCOUNTER — Ambulatory Visit: Payer: BC Managed Care – PPO | Admitting: Nurse Practitioner

## 2021-07-22 ENCOUNTER — Other Ambulatory Visit: Payer: Self-pay

## 2021-07-22 DIAGNOSIS — I10 Essential (primary) hypertension: Secondary | ICD-10-CM | POA: Diagnosis not present

## 2021-07-22 DIAGNOSIS — G4700 Insomnia, unspecified: Secondary | ICD-10-CM | POA: Diagnosis not present

## 2021-07-22 DIAGNOSIS — Z0001 Encounter for general adult medical examination with abnormal findings: Secondary | ICD-10-CM

## 2021-07-22 DIAGNOSIS — F32A Depression, unspecified: Secondary | ICD-10-CM

## 2021-07-22 MED ORDER — TRAZODONE HCL 50 MG PO TABS: 25.0000 mg | ORAL_TABLET | Freq: Every evening | ORAL | 3 refills | Status: DC | PRN

## 2021-07-22 NOTE — Assessment & Plan Note (Signed)
-  followed by therapy; she will f/u with her -she is not interested in medication today

## 2021-07-22 NOTE — Patient Instructions (Signed)
We will complete a physical and med check (for trazodone) in 1 month.  Please have fasting labs drawn 2-3 days prior to your appointment so we can discuss the results during your office visit.

## 2021-07-22 NOTE — Progress Notes (Signed)
Acute Office Visit  Subjective:    Patient ID: Alisha Brock, female    DOB: 19-Jun-1976, 45 y.o.   MRN: [id number]  Chief Complaint  Patient presents with   Hypertension    Follow up   Weight Check    Follow up    Hypertension  Patient is in today for HTN follow-up. At her last OV, she was started on phentermine, but it appears she only took this for 30 days, and it would have stopped in February.  She has hx of depression, and she has been followed by a therapist. She is not interested in medication at this time. She states her husband stopped working in [employer], she has a new job, going through renovations, son graduated and is out of the house, and she is not sleeping well.  Past Medical History:  Diagnosis Date   Allergy    Anemia    Herniated lumbar intervertebral disc    Obesity    Sleep apnea     Past Surgical History:  Procedure Laterality Date   BARIATRIC SURGERY  11/2015   CESAREAN SECTION  August 2004   DILATION AND EVACUATION N/A 08/12/2018   Procedure: DILATATION AND EVACUATION;  Surgeon: Bobbye Charleston, MD;  Location: [facility];  Service: Gynecology;  Laterality: N/A;   DILATION AND EVACUATION N/A 02/10/2019   Procedure: DILATATION AND EVACUATION;  Surgeon: Bobbye Charleston, MD;  Location: [facility];  Service: Gynecology;  Laterality: N/A;   HERNIA REPAIR Bilateral    Inguinal Hernia Repair at age 39.   TONSILLECTOMY     WISDOM TOOTH EXTRACTION  2010    Family History  Problem Relation Age of Onset   Hypertension Mother    Hypercholesterolemia Father    Glaucoma Father    Drug abuse Sister    Bipolar disorder Sister    Arthritis Brother     Social History   Socioeconomic History   Marital status: Married    Spouse name: Thurmond Butts   Number of children: 1   Years of education: Not on file   Highest education level: Some college, no degree  Occupational History   Not on file  Tobacco Use   Smoking status: Never    Smokeless tobacco: Never  Substance and Sexual Activity   Alcohol use: Yes    Comment: occ   Drug use: No   Sexual activity: Yes    Birth control/protection: Pill  Other Topics Concern   Not on file  Social History Narrative   Lives with Thurmond Butts husband of 61 years   Son Ari-17 in August      Enjoys: bike riding, travel, spend time with family      Diet: eats all food groups   Caffeine: 1-2 week   Water: 1 cup daily         Wears seat belt    Does not use phone while driving, hands free   Oceanographer at home    Weapons in lock box   Social Determinants of Health   Financial Resource Strain: Not on file  Food Insecurity: Not on file  Transportation Needs: Not on file  Physical Activity: Not on file  Stress: Not on file  Social Connections: Not on file  Intimate Partner Violence: Not on file    Outpatient Medications Prior to Visit  Medication Sig Dispense Refill   Cyanocobalamin (VITAMIN B 12 PO) Take 1 tablet by mouth daily.     Fe Bisgly-Vit C-Vit B12-FA (GENTLE  IRON PO) Take 1 tablet by mouth daily.     JOLESSA 0.15-0.03 MG tablet Take 1 tablet by mouth daily.     levocetirizine (XYZAL) 5 MG tablet TAKE 1 TABLET(5 MG) BY MOUTH EVERY EVENING 90 tablet 1   lisinopril (ZESTRIL) 10 MG tablet TAKE 1 TABLET(10 MG) BY MOUTH DAILY 30 tablet 3   phentermine (ADIPEX-P) 37.5 MG tablet Take 1 tablet (37.5 mg total) by mouth daily before breakfast. (Patient not taking: Reported on 07/22/2021) 30 tablet 0   No facility-administered medications prior to visit.    Not on File  Review of Systems  Constitutional: Negative.   Respiratory: Negative.    Cardiovascular: Negative.   Psychiatric/Behavioral:  Positive for sleep disturbance. Negative for self-injury and suicidal ideas.       Objective:    Physical Exam Constitutional:      Appearance: Normal appearance. She is obese.  Cardiovascular:     Rate and Rhythm: Normal rate and regular rhythm.     Pulses: Normal pulses.      Heart sounds: Normal heart sounds.  Pulmonary:     Effort: Pulmonary effort is normal.     Breath sounds: Normal breath sounds.  Musculoskeletal:        General: Normal range of motion.  Neurological:     Mental Status: She is alert.  Psychiatric:        Mood and Affect: Mood normal.        Behavior: Behavior normal.        Thought Content: Thought content normal.        Judgment: Judgment normal.    BP 126/82 (BP Location: Left Arm, Patient Position: Sitting, Cuff Size: Large)   Pulse 76   Ht 5\' 3"  (1.6 m)   Wt 229 lb (103.9 kg)   LMP 07/20/2021 (Approximate)   SpO2 98%   BMI 40.57 kg/m  Wt Readings from Last 3 Encounters:  07/22/21 229 lb (103.9 kg)  01/02/21 218 lb (98.9 kg)  12/03/20 220 lb (99.8 kg)    Health Maintenance Due  Topic Date Due   Hepatitis C Screening  Never done   TETANUS/TDAP  Never done   PAP SMEAR-Modifier  Never done   INFLUENZA VACCINE  07/21/2021    There are no preventive care reminders to display for this patient.   Lab Results  Component Value Date   TSH 1.31 05/09/2020   Lab Results  Component Value Date   WBC 6.1 05/09/2020   HGB 13.3 05/09/2020   HCT 41.2 05/09/2020   MCV 83.7 05/09/2020   PLT 383 05/09/2020   Lab Results  Component Value Date   NA 138 05/09/2020   K 4.3 05/09/2020   CO2 27 05/09/2020   GLUCOSE 83 05/09/2020   BUN 6 (L) 05/09/2020   CREATININE 0.77 05/09/2020   BILITOT 0.6 05/09/2020   AST 12 05/09/2020   ALT 7 05/09/2020   PROT 6.1 05/09/2020   CALCIUM 8.7 05/09/2020   Lab Results  Component Value Date   CHOL 157 05/09/2020   Lab Results  Component Value Date   HDL 46 (L) 05/09/2020   Lab Results  Component Value Date   LDLCALC 96 05/09/2020   Lab Results  Component Value Date   TRIG 60 05/09/2020   Lab Results  Component Value Date   CHOLHDL 3.4 05/09/2020   Lab Results  Component Value Date   HGBA1C 5.7 (H) 05/09/2020       Assessment & Plan:  Problem List Items  Addressed This Visit       Cardiovascular and Mediastinum   Essential hypertension    BP Readings from Last 3 Encounters:  07/22/21 126/82  01/02/21 120/81  12/03/20 128/78  -continue lisinopril      Relevant Orders   CBC with Differential/Platelet   CMP14+EGFR   Lipid Panel With LDL/HDL Ratio     Other   Depression    -followed by therapy; she will f/u with her -she is not interested in medication today       Relevant Medications   traZODone (DESYREL) 50 MG tablet   Morbid obesity ([place]) - Primary    Wt Readings from Last 3 Encounters:  07/22/21 229 lb (103.9 kg)  01/02/21 218 lb (98.9 kg)  12/03/20 220 lb (99.8 kg)   BMI Readings from Last 3 Encounters:  07/22/21 40.57 kg/m  01/02/21 38.62 kg/m  12/03/20 38.97 kg/m  -she was on phentermine previously, but has since gained 9 pounds and is back in the morbidly obese BMI range -she tried phentermine until she plateaued early 2022 -may consider weight management if diet/exercise are unsuccessful      Relevant Orders   TSH   Insomnia    -has been stressed lately and husband snores; she has fatigue from lack of sleep -Rx. Trazodone -work note; return to work 07/24/21       Relevant Medications   traZODone (DESYREL) 50 MG tablet   Other Visit Diagnoses     Encounter for general adult medical examination with abnormal findings       Relevant Orders   CBC with Differential/Platelet   CMP14+EGFR   Lipid Panel With LDL/HDL Ratio   TSH        Meds ordered this encounter  Medications   traZODone (DESYREL) 50 MG tablet    Sig: Take 0.5-1 tablets (25-50 mg total) by mouth at bedtime as needed for sleep.    Dispense:  30 tablet    Refill:  Wallace, NP

## 2021-07-22 NOTE — Assessment & Plan Note (Signed)
BP Readings from Last 3 Encounters:  07/22/21 126/82  01/02/21 120/81  12/03/20 128/78   -continue lisinopril

## 2021-07-22 NOTE — Assessment & Plan Note (Addendum)
Wt Readings from Last 3 Encounters:  07/22/21 229 lb (103.9 kg)  01/02/21 218 lb (98.9 kg)  12/03/20 220 lb (99.8 kg)   BMI Readings from Last 3 Encounters:  07/22/21 40.57 kg/m  01/02/21 38.62 kg/m  12/03/20 38.97 kg/m   -she was on phentermine previously, but has since gained 9 pounds and is back in the morbidly obese BMI range -she tried phentermine until she plateaued early 2022 -may consider weight management if diet/exercise are unsuccessful

## 2021-07-22 NOTE — Assessment & Plan Note (Signed)
-  has been stressed lately and husband snores; she has fatigue from lack of sleep -Rx. Trazodone -work note; return to work 07/24/21

## 2021-08-21 DIAGNOSIS — I10 Essential (primary) hypertension: Secondary | ICD-10-CM | POA: Diagnosis not present

## 2021-08-21 DIAGNOSIS — Z0001 Encounter for general adult medical examination with abnormal findings: Secondary | ICD-10-CM | POA: Diagnosis not present

## 2021-08-22 LAB — CBC WITH DIFFERENTIAL/PLATELET
Basophils Absolute: 0.1 10*3/uL (ref 0.0–0.2)
Basos: 1 %
EOS (ABSOLUTE): 0.1 10*3/uL (ref 0.0–0.4)
Eos: 1 %
Hematocrit: 39.9 % (ref 34.0–46.6)
Hemoglobin: 12.9 g/dL (ref 11.1–15.9)
Immature Grans (Abs): 0 10*3/uL (ref 0.0–0.1)
Immature Granulocytes: 0 %
Lymphocytes Absolute: 2.7 10*3/uL (ref 0.7–3.1)
Lymphs: 43 %
MCH: 26.9 pg (ref 26.6–33.0)
MCHC: 32.3 g/dL (ref 31.5–35.7)
MCV: 83 fL (ref 79–97)
Monocytes Absolute: 0.5 10*3/uL (ref 0.1–0.9)
Monocytes: 7 %
Neutrophils Absolute: 2.9 10*3/uL (ref 1.4–7.0)
Neutrophils: 48 %
Platelets: 355 10*3/uL (ref 150–450)
RBC: 4.8 x10E6/uL (ref 3.77–5.28)
RDW: 13.2 % (ref 11.7–15.4)
WBC: 6.2 10*3/uL (ref 3.4–10.8)

## 2021-08-22 LAB — LIPID PANEL WITH LDL/HDL RATIO
Cholesterol, Total: 171 mg/dL (ref 100–199)
HDL: 51 mg/dL (ref >39)
LDL Chol Calc (NIH): 108 mg/dL — ABNORMAL HIGH (ref 0–99)
LDL/HDL Ratio: 2.1 ratio (ref 0.0–3.2)
Triglycerides: 60 mg/dL (ref 0–149)
VLDL Cholesterol Cal: 12 mg/dL (ref 5–40)

## 2021-08-22 LAB — CMP14+EGFR
ALT: 8 IU/L (ref 0–32)
AST: 13 IU/L (ref 0–40)
Albumin/Globulin Ratio: 1.6 (ref 1.2–2.2)
Albumin: 3.8 g/dL (ref 3.8–4.8)
Alkaline Phosphatase: 44 IU/L (ref 44–121)
BUN/Creatinine Ratio: 11 (ref 9–23)
BUN: 9 mg/dL (ref 6–24)
Bilirubin Total: 0.4 mg/dL (ref 0.0–1.2)
CO2: 22 mmol/L (ref 20–29)
Calcium: 9 mg/dL (ref 8.7–10.2)
Chloride: 103 mmol/L (ref 96–106)
Creatinine, Ser: 0.79 mg/dL (ref 0.57–1.00)
Globulin, Total: 2.4 g/dL (ref 1.5–4.5)
Glucose: 86 mg/dL (ref 65–99)
Potassium: 4.2 mmol/L (ref 3.5–5.2)
Sodium: 139 mmol/L (ref 134–144)
Total Protein: 6.2 g/dL (ref 6.0–8.5)
eGFR: 95 mL/min/{1.73_m2} (ref >59)

## 2021-08-22 LAB — TSH: TSH: 1.32 u[IU]/mL (ref 0.450–4.500)

## 2021-08-22 NOTE — Progress Notes (Signed)
LDL is slightly elevated. Try cutting back on fried/fatty foods or increasing exercise.

## 2021-08-23 ENCOUNTER — Other Ambulatory Visit: Payer: Self-pay | Admitting: Family Medicine

## 2021-08-23 DIAGNOSIS — I10 Essential (primary) hypertension: Secondary | ICD-10-CM

## 2021-08-26 ENCOUNTER — Encounter: Payer: BC Managed Care – PPO | Admitting: Nurse Practitioner

## 2021-08-26 ENCOUNTER — Encounter: Payer: Self-pay | Admitting: Nurse Practitioner

## 2021-08-26 NOTE — Telephone Encounter (Signed)
Contacted patient and rescheduled physical appointment.

## 2021-10-12 ENCOUNTER — Other Ambulatory Visit: Payer: Self-pay | Admitting: Family Medicine

## 2021-10-12 DIAGNOSIS — I10 Essential (primary) hypertension: Secondary | ICD-10-CM

## 2021-10-20 ENCOUNTER — Other Ambulatory Visit: Payer: Self-pay | Admitting: *Deleted

## 2021-10-20 DIAGNOSIS — J3089 Other allergic rhinitis: Secondary | ICD-10-CM

## 2021-10-20 MED ORDER — LEVOCETIRIZINE DIHYDROCHLORIDE 5 MG PO TABS: ORAL_TABLET | ORAL | 1 refills | Status: DC

## 2021-11-17 ENCOUNTER — Encounter: Payer: BC Managed Care – PPO | Admitting: Nurse Practitioner

## 2021-12-01 ENCOUNTER — Other Ambulatory Visit: Payer: Self-pay

## 2021-12-01 DIAGNOSIS — G4700 Insomnia, unspecified: Secondary | ICD-10-CM

## 2021-12-01 MED ORDER — TRAZODONE HCL 50 MG PO TABS: 25.0000 mg | ORAL_TABLET | Freq: Every evening | ORAL | 3 refills | Status: DC | PRN

## 2021-12-25 ENCOUNTER — Encounter: Payer: Self-pay | Admitting: Nurse Practitioner

## 2021-12-25 ENCOUNTER — Other Ambulatory Visit: Payer: Self-pay

## 2021-12-25 ENCOUNTER — Ambulatory Visit (INDEPENDENT_AMBULATORY_CARE_PROVIDER_SITE_OTHER): Payer: BC Managed Care – PPO | Admitting: Nurse Practitioner

## 2021-12-25 VITALS — BP 123/81 | HR 93 | Resp 16 | Ht 63.0 in | Wt 240.0 lb

## 2021-12-25 DIAGNOSIS — Z0001 Encounter for general adult medical examination with abnormal findings: Secondary | ICD-10-CM

## 2021-12-25 DIAGNOSIS — I10 Essential (primary) hypertension: Secondary | ICD-10-CM | POA: Diagnosis not present

## 2021-12-25 DIAGNOSIS — F419 Anxiety disorder, unspecified: Secondary | ICD-10-CM | POA: Diagnosis not present

## 2021-12-25 DIAGNOSIS — Z0000 Encounter for general adult medical examination without abnormal findings: Secondary | ICD-10-CM

## 2021-12-25 DIAGNOSIS — Z23 Encounter for immunization: Secondary | ICD-10-CM | POA: Diagnosis not present

## 2021-12-25 NOTE — Assessment & Plan Note (Signed)
DASH diet and commitment to daily physical activity for a minimum of 30 minutes discussed and encouraged, as a part of hypertension management. The importance of attaining a healthy weight is also discussed.  BP/Weight 12/25/2021 07/22/2021 01/02/2021 12/03/2020 11/05/2020 10/10/2020 09/05/2020  Systolic BP 123 126 120 128 124 126 124  Diastolic BP 81 82 81 78 80 70 76  Wt. (Lbs) 240.03 229 218 220 224 227 233.8  BMI 42.52 40.57 38.62 38.97 39.68 40.21 41.42  continue current med.

## 2021-12-25 NOTE — Patient Instructions (Signed)
Please get your labs done tomorrow. ° °It is important that you exercise regularly at least 30 minutes 5 times a week.  °Think about what you will eat, plan ahead. °Choose " clean, green, fresh or frozen" over canned, processed or packaged foods which are more sugary, salty and fatty. °70 to 75% of food eaten should be vegetables and fruit. °Three meals at set times with snacks allowed between meals, but they must be fruit or vegetables. °Aim to eat over a 12 hour period , example 7 am to 7 pm, and STOP after  your last meal of the day. °Drink water,generally about 64 ounces per day, no other drink is as healthy. Fruit juice is best enjoyed in a healthy way, by EATING the fruit. ° °Thanks for choosing [facility], we consider it a privelige to serve you.  °

## 2021-12-25 NOTE — Progress Notes (Signed)
Established Patient Office Visit  Subjective:  Patient ID: Alisha Brock, female    DOB: [birth year]  Age: 46 y.o. MRN: [id number]  CC:  Chief Complaint  Patient presents with   Annual Exam   Anxiety    Pt states she has anxiety before going to work  Also Legrand Como prescribed medicine for sleep and she wants to discuss options with that.   Obesity    Wants to discuss weight loss options    HPI Alisha Brock presents for annual exam  Anxiety: had 2 miscarriages in 2020 led to anxiety and depression, was seeing therapist   in 2021, therapy helped. She Changed job  recently last year October and has anxiety with her new  job, has anxiety every time she thinks about her job, does not want to do medications. Does lavendar oil at night to help her relax. Will check wit her HR dept to see if they have free therapist.   Due for TDAP, Refused flu shot, got pfizer covid vaccines and one booster.  Up to date with PAP and mammogram.   Past Medical History:  Diagnosis Date   Allergy    Anemia    Herniated lumbar intervertebral disc    Obesity    Sleep apnea     Past Surgical History:  Procedure Laterality Date   BARIATRIC SURGERY  11/2015   CESAREAN SECTION  August 2004   DILATION AND EVACUATION N/A 08/12/2018   Procedure: DILATATION AND EVACUATION;  Surgeon: Bobbye Charleston, MD;  Location: [facility];  Service: Gynecology;  Laterality: N/A;   DILATION AND EVACUATION N/A 02/10/2019   Procedure: DILATATION AND EVACUATION;  Surgeon: Bobbye Charleston, MD;  Location: [place];  Service: Gynecology;  Laterality: N/A;   HERNIA REPAIR Bilateral    Inguinal Hernia Repair at age 61.   TONSILLECTOMY     WISDOM TOOTH EXTRACTION  2010    Family History  Problem Relation Age of Onset   Hypertension Mother    Hypercholesterolemia Father    Glaucoma Father    Deep vein thrombosis Father    Drug abuse Sister    Bipolar disorder Sister    Arthritis  Brother    Colon polyps Maternal Aunt    Colon cancer Paternal Aunt    Heart disease Paternal Uncle    Diabetes Mellitus II Paternal Grandmother    Diabetes type II Paternal Grandfather    Heart disease Paternal Grandfather    Breast cancer Neg Hx    Lung cancer Neg Hx    Cervical cancer Neg Hx     Social History   Socioeconomic History   Marital status: Married    Spouse name: Ryan   Number of children: 1   Years of education: Not on file   Highest education level: Some college, no degree  Occupational History   Not on file  Tobacco Use   Smoking status: Never   Smokeless tobacco: Never  Substance and Sexual Activity   Alcohol use: Yes    Comment: occ   Drug use: No   Sexual activity: Yes    Birth control/protection: Pill  Other Topics Concern   Not on file  Social History Narrative   Lives with Thurmond Butts husband of 12 years   Son Ari-19 in August      Enjoys: bike riding, travel, spend time with family      Diet: eats all food groups           Wears  seat belt    Does not use phone while driving, hands free   Smoke detectors at home    Weapons in lock box   Social Determinants of Health   Financial Resource Strain: Not on file  Food Insecurity: Not on file  Transportation Needs: Not on file  Physical Activity: Not on file  Stress: Not on file  Social Connections: Not on file  Intimate Partner Violence: Not on file    Outpatient Medications Prior to Visit  Medication Sig Dispense Refill   cholecalciferol (VITAMIN D3) 25 MCG (1000 UNIT) tablet Take 1,000 Units by mouth daily. Takes 10000 units daily.     Cyanocobalamin (VITAMIN B 12 PO) Take 1 tablet by mouth daily.     Fe Bisgly-Vit C-Vit B12-FA (GENTLE IRON PO) Take 1 tablet by mouth daily.     JOLESSA 0.15-0.03 MG tablet Take 1 tablet by mouth daily.     levocetirizine (XYZAL) 5 MG tablet TAKE 1 TABLET(5 MG) BY MOUTH EVERY EVENING 90 tablet 1   lisinopril (ZESTRIL) 10 MG tablet TAKE 1 TABLET(10 MG) BY  MOUTH DAILY 90 tablet 0   traZODone (DESYREL) 50 MG tablet Take 0.5-1 tablets (25-50 mg total) by mouth at bedtime as needed for sleep. 30 tablet 3   No facility-administered medications prior to visit.    Not on File  ROS Review of Systems  Constitutional: Negative.   HENT: Negative.    Eyes: Negative.   Respiratory: Negative.    Cardiovascular: Negative.   Gastrointestinal: Negative.   Endocrine: Negative.   Genitourinary: Negative.   Musculoskeletal: Negative.   Skin: Negative.   Allergic/Immunologic: Negative.   Neurological: Negative.   Hematological: Negative.   Psychiatric/Behavioral: Negative.       Objective:   Refused breast exam today.  Physical Exam Constitutional:      General: She is not in acute distress.    Appearance: She is obese. She is not ill-appearing, toxic-appearing or diaphoretic.  HENT:     Right Ear: Tympanic membrane, ear canal and external ear normal. There is no impacted cerumen.     Left Ear: Tympanic membrane, ear canal and external ear normal. There is no impacted cerumen.     Nose: No congestion or rhinorrhea.     Mouth/Throat:     Mouth: Mucous membranes are moist.     Pharynx: Oropharynx is clear. No oropharyngeal exudate or posterior oropharyngeal erythema.  Eyes:     General: No scleral icterus.       Right eye: No discharge.        Left eye: No discharge.     Extraocular Movements: Extraocular movements intact.     Conjunctiva/sclera: Conjunctivae normal.  Neck:     Vascular: No carotid bruit.  Cardiovascular:     Rate and Rhythm: Normal rate and regular rhythm.     Pulses: Normal pulses.     Heart sounds: Normal heart sounds. No murmur heard.   No friction rub. No gallop.  Pulmonary:     Effort: No respiratory distress.     Breath sounds: No stridor. No wheezing, rhonchi or rales.  Chest:     Chest wall: No tenderness.  Abdominal:     General: There is no distension.     Palpations: Abdomen is soft. There is no mass.      Tenderness: There is no abdominal tenderness. There is no right CVA tenderness, left CVA tenderness, guarding or rebound.     Hernia: No hernia is present.  Musculoskeletal:        General: No swelling, tenderness, deformity or signs of injury.     Cervical back: No rigidity or tenderness.     Right lower leg: No edema.     Left lower leg: No edema.  Lymphadenopathy:     Cervical: No cervical adenopathy.  Skin:    Capillary Refill: Capillary refill takes less than 2 seconds.     Coloration: Skin is not jaundiced or pale.     Findings: No bruising, erythema, lesion or rash.  Neurological:     Mental Status: She is alert and oriented to person, place, and time.     Cranial Nerves: No cranial nerve deficit.     Sensory: No sensory deficit.     Motor: No weakness.     Coordination: Coordination normal.     Gait: Gait normal.     Deep Tendon Reflexes: Reflexes normal.  Psychiatric:        Mood and Affect: Mood normal.        Behavior: Behavior normal.        Thought Content: Thought content normal.        Judgment: Judgment normal.     Comments: Has anxiety    BP 123/81    Pulse 93    Resp 16    Ht $R'5\' 3"'eh$  (1.6 m)    Wt 240 lb 0.5 oz (108.9 kg)    SpO2 98%    BMI 42.52 kg/m  Wt Readings from Last 3 Encounters:  12/25/21 240 lb 0.5 oz (108.9 kg)  07/22/21 229 lb (103.9 kg)  01/02/21 218 lb (98.9 kg)     Health Maintenance Due  Topic Date Due   Pneumococcal Vaccine 85-85 Years old (1 - PCV) Never done   Hepatitis C Screening  Never done   COVID-19 Vaccine (4 - Booster for Pfizer series) 02/21/2021   INFLUENZA VACCINE  Never done    There are no preventive care reminders to display for this patient.  Lab Results  Component Value Date   TSH 1.320 08/21/2021   Lab Results  Component Value Date   WBC 6.2 08/21/2021   HGB 12.9 08/21/2021   HCT 39.9 08/21/2021   MCV 83 08/21/2021   PLT 355 08/21/2021   Lab Results  Component Value Date   NA 139 08/21/2021   K 4.2  08/21/2021   CO2 22 08/21/2021   GLUCOSE 86 08/21/2021   BUN 9 08/21/2021   CREATININE 0.79 08/21/2021   BILITOT 0.4 08/21/2021   ALKPHOS 44 08/21/2021   AST 13 08/21/2021   ALT 8 08/21/2021   PROT 6.2 08/21/2021   ALBUMIN 3.8 08/21/2021   CALCIUM 9.0 08/21/2021   EGFR 95 08/21/2021   Lab Results  Component Value Date   CHOL 171 08/21/2021   Lab Results  Component Value Date   HDL 51 08/21/2021   Lab Results  Component Value Date   LDLCALC 108 (H) 08/21/2021   Lab Results  Component Value Date   TRIG 60 08/21/2021   Lab Results  Component Value Date   CHOLHDL 3.4 05/09/2020   Lab Results  Component Value Date   HGBA1C 5.7 (H) 05/09/2020      Assessment & Plan:   Problem List Items Addressed This Visit       Cardiovascular and Mediastinum   Essential hypertension    DASH diet and commitment to daily physical activity for a minimum of 30 minutes discussed and encouraged, as a part  of hypertension management. The importance of attaining a healthy weight is also discussed.  BP/Weight 12/25/2021 07/22/2021 01/02/2021 12/03/2020 11/05/2020 10/10/2020 5/85/9292  Systolic BP 446 286 381 771 165 790 383  Diastolic BP 81 82 81 78 80 70 76  Wt. (Lbs) 240.03 229 218 220 224 227 233.8  BMI 42.52 40.57 38.62 38.97 39.68 40.21 41.42  continue current med.            Other   Morbid obesity ([place])    Importance of healthy food choices with portion control discussed as well as eating regularly within 12  hour window.   The need to choose clean green food 50%-75% of time is discussed as well as make water the primary drink and set a goal for 64 ounces daily.  Patient reeducated about the importance of committment to minimum of 150 minutes of exercise per week.  Three meals at set times with snacks allowed between meals but they must be fruit or vegetable.   Aim to eat  over 12 hour period  for example 7 am to 7 pm. Stop after your last meal of the day.  Wt Readings  from Last 3 Encounters:  12/25/21 240 lb 0.5 oz (108.9 kg)  07/22/21 229 lb (103.9 kg)  01/02/21 218 lb (98.9 kg)  she was previously on phentermine,she stopped it due to concerns with BP.  she will star doing diet and exercise.       Annual physical exam - Primary    Annual exam as documented.  Counseling done include healthy lifestyle involving committing to 150 minutes of exercise per week, heart healthy diet, and attaining healthy weight. The importance of adequate sleep also discussed.  Regular use of seat belt and home safety were also discussed . Changes in health habits are decided on by patient with goals and time frames set for achieving them. Immunization and cancer screening  needs are specifically addressed at this visit.        Relevant Orders   Vitamin D (25 hydroxy)   CMP14+EGFR   HgB A1c   Lipid Profile   Hepatitis C antibody   Anxiety    Due to been on a new job, she does not want medications, will talk to her HR to see if they have free therapist.  PHQ 9 score 0. GAD 7 score 10. deny SI,HI       No orders of the defined types were placed in this encounter.   Follow-up: Return in about 6 months (around 06/24/2022).    Renee Rival, FNP

## 2021-12-25 NOTE — Assessment & Plan Note (Signed)
Due to been on a new job, she does not want medications, will talk to her HR to see if they have free therapist.  PHQ 9 score 0. GAD 7 score 10. deny SI,HI

## 2021-12-25 NOTE — Assessment & Plan Note (Signed)
Importance of healthy food choices with portion control discussed as well as eating regularly within 12  hour window.   The need to choose clean green food 50%-75% of time is discussed as well as make water the primary drink and set a goal for 64 ounces daily.  Patient reeducated about the importance of committment to minimum of 150 minutes of exercise per week.  Three meals at set times with snacks allowed between meals but they must be fruit or vegetable.   Aim to eat  over 12 hour period  for example 7 am to 7 pm. Stop after your last meal of the day.  Wt Readings from Last 3 Encounters:  12/25/21 240 lb 0.5 oz (108.9 kg)  07/22/21 229 lb (103.9 kg)  01/02/21 218 lb (98.9 kg)  she was previously on phentermine,she stopped it due to concerns with BP.  she will star doing diet and exercise.

## 2021-12-25 NOTE — Assessment & Plan Note (Signed)
Patient educated on CDC recommendation for the vaccine. Verbal consent was obtained from the patient, vaccine administered by nurse, no sign of adverse reactions noted at this time. Patient education on arm soreness and use of tylenol or ibuprofen (if safe) for this patient  was discussed. Patient educated on the signs and symptoms of adverse effect and advise to contact the office if they occur °

## 2021-12-25 NOTE — Assessment & Plan Note (Signed)
Annual exam as documented.  ?Counseling done include healthy lifestyle involving committing to 150 minutes of exercise per week, heart healthy diet, and attaining healthy weight. The importance of adequate sleep also discussed.  ?Regular use of seat belt and home safety were also discussed . ?Changes in health habits are decided on by patient with goals and time frames set for achieving them. ?Immunization and cancer screening  needs are specifically addressed at this visit.   ?

## 2021-12-26 DIAGNOSIS — Z0000 Encounter for general adult medical examination without abnormal findings: Secondary | ICD-10-CM | POA: Diagnosis not present

## 2021-12-27 ENCOUNTER — Other Ambulatory Visit: Payer: Self-pay | Admitting: Family Medicine

## 2021-12-27 DIAGNOSIS — I10 Essential (primary) hypertension: Secondary | ICD-10-CM

## 2021-12-27 LAB — LIPID PANEL
Chol/HDL Ratio: 3.7 ratio (ref 0.0–4.4)
Cholesterol, Total: 158 mg/dL (ref 100–199)
HDL: 43 mg/dL (ref >39)
LDL Chol Calc (NIH): 103 mg/dL — ABNORMAL HIGH (ref 0–99)
Triglycerides: 59 mg/dL (ref 0–149)
VLDL Cholesterol Cal: 12 mg/dL (ref 5–40)

## 2021-12-27 LAB — CMP14+EGFR
ALT: 9 IU/L (ref 0–32)
AST: 14 IU/L (ref 0–40)
Albumin/Globulin Ratio: 1.3 (ref 1.2–2.2)
Albumin: 3.4 g/dL — ABNORMAL LOW (ref 3.8–4.8)
Alkaline Phosphatase: 48 IU/L (ref 44–121)
BUN/Creatinine Ratio: 10 (ref 9–23)
BUN: 8 mg/dL (ref 6–24)
Bilirubin Total: 0.4 mg/dL (ref 0.0–1.2)
CO2: 23 mmol/L (ref 20–29)
Calcium: 8.5 mg/dL — ABNORMAL LOW (ref 8.7–10.2)
Chloride: 106 mmol/L (ref 96–106)
Creatinine, Ser: 0.77 mg/dL (ref 0.57–1.00)
Globulin, Total: 2.7 g/dL (ref 1.5–4.5)
Glucose: 76 mg/dL (ref 70–99)
Potassium: 4.4 mmol/L (ref 3.5–5.2)
Sodium: 139 mmol/L (ref 134–144)
Total Protein: 6.1 g/dL (ref 6.0–8.5)
eGFR: 97 mL/min/{1.73_m2} (ref >59)

## 2021-12-27 LAB — VITAMIN D 25 HYDROXY (VIT D DEFICIENCY, FRACTURES): Vit D, 25-Hydroxy: 136 ng/mL — ABNORMAL HIGH (ref 30.0–100.0)

## 2021-12-27 LAB — HEMOGLOBIN A1C
Est. average glucose Bld gHb Est-mCnc: 123 mg/dL
Hgb A1c MFr Bld: 5.9 % — ABNORMAL HIGH (ref 4.8–5.6)

## 2021-12-27 LAB — HEPATITIS C ANTIBODY: Hep C Virus Ab: <0.1 s/co ratio (ref 0.0–0.9)

## 2021-12-29 ENCOUNTER — Other Ambulatory Visit: Payer: Self-pay | Admitting: Nurse Practitioner

## 2021-12-29 ENCOUNTER — Encounter: Payer: Self-pay | Admitting: Nurse Practitioner

## 2021-12-29 MED ORDER — SEMAGLUTIDE(0.25 OR 0.5MG/DOS) 2 MG/1.5ML ~~LOC~~ SOPN: 0.5000 mg | PEN_INJECTOR | SUBCUTANEOUS | 0 refills | Status: DC

## 2021-12-29 MED ORDER — WEGOVY 0.25 MG/0.5ML ~~LOC~~ SOAJ: 0.2500 mg | SUBCUTANEOUS | 0 refills | Status: AC

## 2022-01-28 ENCOUNTER — Encounter: Payer: Self-pay | Admitting: Nurse Practitioner

## 2022-01-28 NOTE — Telephone Encounter (Signed)
Please advise seen 1/5

## 2022-02-02 ENCOUNTER — Ambulatory Visit (INDEPENDENT_AMBULATORY_CARE_PROVIDER_SITE_OTHER): Payer: BC Managed Care – PPO | Admitting: Nurse Practitioner

## 2022-02-02 ENCOUNTER — Other Ambulatory Visit: Payer: Self-pay

## 2022-02-02 ENCOUNTER — Encounter: Payer: Self-pay | Admitting: Nurse Practitioner

## 2022-02-02 VITALS — BP 120/78 | HR 95 | Ht 63.0 in | Wt 244.0 lb

## 2022-02-02 DIAGNOSIS — I10 Essential (primary) hypertension: Secondary | ICD-10-CM

## 2022-02-02 DIAGNOSIS — Z2821 Immunization not carried out because of patient refusal: Secondary | ICD-10-CM | POA: Diagnosis not present

## 2022-02-02 NOTE — Patient Instructions (Signed)
°  Please monitor your blood pressure at home if your blood pressure stays over 140/80, please let us know. Stop taking lisinopril and vitamin  D.   It is important that you exercise regularly at least 30 minutes 5 times a week.  Think about what you will eat, plan ahead. Choose " clean, green, fresh or frozen" over canned, processed or packaged foods which are more sugary, salty and fatty. 70 to 75% of food eaten should be vegetables and fruit. Three meals at set times with snacks allowed between meals, but they must be fruit or vegetables. Aim to eat over a 12 hour period , example 7 am to 7 pm, and STOP after  your last meal of the day. Drink water,generally about 64 ounces per day, no other drink is as healthy. Fruit juice is best enjoyed in a healthy way, by EATING the fruit.  Thanks for choosing [facility], we consider it a privelige to serve you.

## 2022-02-02 NOTE — Assessment & Plan Note (Signed)
Need for vaccine discussed with pt , she verbalis ed understanding.

## 2022-02-02 NOTE — Assessment & Plan Note (Signed)
DASH diet and commitment to daily physical activity for a minimum of 30 minutes discussed and encouraged, as a part of hypertension management. The importance of attaining a healthy weight is also discussed.  BP/Weight 02/02/2022 12/25/2021 07/22/2021 01/02/2021 12/03/2020 11/05/2020 10/10/2020  Systolic BP 120 123 126 120 128 124 126  Diastolic BP 78 81 82 81 78 80 70  Wt. (Lbs) 244 240.03 229 218 220 224 227  BMI 43.22 42.52 40.57 38.62 38.97 39.68 40.21  monitor BP at home, pt will call the office if her BP stays above 140/80.  Stop lisinopril.  loose weight, alcohol.

## 2022-02-02 NOTE — Assessment & Plan Note (Signed)
Importance of healthy food choices with portion control discussed as well as eating regularly within 12  hour window.   The need to choose clean green food 50%-75% of time is discussed as well as make water the primary drink and set a goal for 64 ounces daily.  Patient reeducated about the importance of committment to minimum of 150 minutes of exercise per week.  Three meals at set times with snacks allowed between meals but they must be fruit or vegetable.   Aim to eat  over 12 hour period  for example 7 am to 7 pm. Stop after your last meal of the day.  Weight: 244 lb (110.7 kg)  Wt Readings from Last 3 Encounters:  02/02/22 244 lb (110.7 kg)  12/25/21 240 lb 0.5 oz (108.9 kg)  07/22/21 229 lb (103.9 kg)

## 2022-02-02 NOTE — Progress Notes (Signed)
° °  Larose Chapnick     MRN: [id number]      DOB: 02-Oct-1976   HPI Ms. Melecio is here for follow up for BP. She would like to come off lisinopril because of the side effects, she stopped using medications about 3 weeks . She has been having cough for about 8 months now. She has not been coughing since she stopped taking lisinopril. She has been checking her BP at home, systolic bp at home has been below 130;s at home. She has been drinking more water. She has stopped drinking alcohol as much.     ROS Denies recent fever or chills. Denies sinus pressure, nasal congestion, ear pain or sore throat. Denies chest congestion, productive cough or wheezing. Denies chest pains, palpitations and leg swelling Denies abdominal pain, nausea, vomiting,diarrhea or constipation.   Denies dysuria, frequency, hesitancy or incontinence. Denies joint pain, swelling and limitation in mobility. Denies headaches, seizures, numbness, or tingling. Denies depression, anxiety or insomnia. Denies skin break down or rash.   PE  BP 120/78    Pulse 95    Ht 5\' 3"  (1.6 m)    Wt 244 lb (110.7 kg)    LMP 01/27/2022 (Approximate)    SpO2 99%    BMI 43.22 kg/m   Patient alert and oriented and in no cardiopulmonary distress.  HEENT: No facial asymmetry, EOMI,     Neck supple .  Chest: Clear to auscultation bilaterally.  CVS: S1, S2 no murmurs, no S3.Regular rate.  ABD: Soft non tender.   Ext: No edema  MS: Adequate ROM spine, shoulders, hips and knees.  Skin: Intact, no ulcerations or rash noted.  Psych: Good eye contact, normal affect. Memory intact not anxious or depressed appearing.  CNS: CN 2-12 intact, power,  normal throughout.no focal deficits noted.   Assessment & Plan

## 2022-03-06 ENCOUNTER — Ambulatory Visit
Admission: EM | Admit: 2022-03-06 | Discharge: 2022-03-06 | Disposition: A | Discharge status: Home / Self Care | Payer: BC Managed Care – PPO | Attending: Urgent Care | Admitting: Urgent Care

## 2022-03-06 ENCOUNTER — Encounter: Payer: Self-pay | Admitting: Nurse Practitioner

## 2022-03-06 ENCOUNTER — Other Ambulatory Visit: Payer: Self-pay

## 2022-03-06 DIAGNOSIS — J019 Acute sinusitis, unspecified: Secondary | ICD-10-CM | POA: Diagnosis not present

## 2022-03-06 DIAGNOSIS — J309 Allergic rhinitis, unspecified: Secondary | ICD-10-CM | POA: Diagnosis not present

## 2022-03-06 MED ORDER — AMOXICILLIN-POT CLAVULANATE 875-125 MG PO TABS: 1.0000 | ORAL_TABLET | Freq: Two times a day (BID) | ORAL | 0 refills | Status: DC

## 2022-03-06 MED ORDER — PREDNISONE 20 MG PO TABS: ORAL_TABLET | ORAL | 0 refills | Status: DC

## 2022-03-06 NOTE — ED Provider Notes (Signed)
?[facility] ? ? ?MRN: [id number] DOB: 1976/02/06 ? ?Subjective:  ? ?Alisha Brock is a 46 y.o. female presenting for 1 week history of persistent sinus congestion, sinus pressure and sinus pain.  She has also had bilateral ear fullness and irritation.  She took a COVID test and was negative this morning.  Has a history of allergic rhinitis and takes Xyzal daily.  No cough, chest pain, shortness of breath or wheezing. ? ?No current facility-administered medications for this encounter. ? ?Current Outpatient Medications:  ?  Cyanocobalamin (VITAMIN B 12 PO), Take 1 tablet by mouth daily., Disp: , Rfl:  ?  Fe Bisgly-Vit C-Vit B12-FA (GENTLE IRON PO), Take 1 tablet by mouth daily., Disp: , Rfl:  ?  JOLESSA 0.15-0.03 MG tablet, Take 1 tablet by mouth daily., Disp: , Rfl:  ?  levocetirizine (XYZAL) 5 MG tablet, TAKE 1 TABLET(5 MG) BY MOUTH EVERY EVENING, Disp: 90 tablet, Rfl: 1 ?  traZODone (DESYREL) 50 MG tablet, Take 0.5-1 tablets (25-50 mg total) by mouth at bedtime as needed for sleep., Disp: 30 tablet, Rfl: 3  ? ?Allergies  ?Allergen Reactions  ? Lisinopril Cough  ? ? ?Past Medical History:  ?Diagnosis Date  ? Allergy   ? Anemia   ? Herniated lumbar intervertebral disc   ? Obesity   ? Sleep apnea   ?  ? ?Past Surgical History:  ?Procedure Laterality Date  ? BARIATRIC SURGERY  11/2015  ? CESAREAN SECTION  August 2004  ? DILATION AND EVACUATION N/A 08/12/2018  ? Procedure: DILATATION AND EVACUATION;  Surgeon: Carrington Clamp, MD;  Location: [facility];  Service: Gynecology;  Laterality: N/A;  ? DILATION AND EVACUATION N/A 02/10/2019  ? Procedure: DILATATION AND EVACUATION;  Surgeon: Carrington Clamp, MD;  Location: [facility];  Service: Gynecology;  Laterality: N/A;  ? HERNIA REPAIR Bilateral   ? Inguinal Hernia Repair at age 74.  ? TONSILLECTOMY    ? WISDOM TOOTH EXTRACTION  2010  ? ? ?Family History  ?Problem Relation Age of Onset  ? Hypertension Mother   ?  Hypercholesterolemia Father   ? Glaucoma Father   ? Deep vein thrombosis Father   ? Drug abuse Sister   ? Bipolar disorder Sister   ? Arthritis Brother   ? Colon polyps Maternal Aunt   ? Colon cancer Paternal Aunt   ? Heart disease Paternal Uncle   ? Diabetes Mellitus II Paternal Grandmother   ? Diabetes type II Paternal Grandfather   ? Heart disease Paternal Grandfather   ? Breast cancer Neg Hx   ? Lung cancer Neg Hx   ? Cervical cancer Neg Hx   ? ? ?Social History  ? ?Tobacco Use  ? Smoking status: Never  ? Smokeless tobacco: Never  ?Substance Use Topics  ? Alcohol use: Yes  ?  Comment: occ  ? Drug use: No  ? ? ?ROS ? ? ?Objective:  ? ?Vitals: ?BP 132/84   Pulse (!) 106   Temp 99 ?F (37.2 ?C)   Resp 18   SpO2 98%  ? ?Physical Exam ?Constitutional:   ?   General: She is not in acute distress. ?   Appearance: Normal appearance. She is well-developed and normal weight. She is not ill-appearing, toxic-appearing or diaphoretic.  ?HENT:  ?   Head: Normocephalic and atraumatic.  ?   Right Ear: Tympanic membrane, ear canal and external ear normal. No drainage or tenderness. No middle ear effusion. There is no impacted cerumen. Tympanic membrane  is not erythematous.  ?   Left Ear: Tympanic membrane, ear canal and external ear normal. No drainage or tenderness.  No middle ear effusion. There is no impacted cerumen. Tympanic membrane is not erythematous.  ?   Nose: Congestion and rhinorrhea present.  ?   Mouth/Throat:  ?   Mouth: Mucous membranes are moist. No oral lesions.  ?   Pharynx: No pharyngeal swelling, oropharyngeal exudate, posterior oropharyngeal erythema or uvula swelling.  ?   Tonsils: No tonsillar exudate or tonsillar abscesses.  ?Eyes:  ?   General: No scleral icterus.    ?   Right eye: No discharge.     ?   Left eye: No discharge.  ?   Extraocular Movements: Extraocular movements intact.  ?   Right eye: Normal extraocular motion.  ?   Left eye: Normal extraocular motion.  ?   Conjunctiva/sclera:  Conjunctivae normal.  ?Cardiovascular:  ?   Rate and Rhythm: Normal rate.  ?   Heart sounds: No murmur heard. ?  No friction rub. No gallop.  ?Pulmonary:  ?   Effort: Pulmonary effort is normal. No respiratory distress.  ?   Breath sounds: No stridor. No wheezing, rhonchi or rales.  ?Chest:  ?   Chest wall: No tenderness.  ?Musculoskeletal:  ?   Cervical back: Normal range of motion and neck supple.  ?Lymphadenopathy:  ?   Cervical: No cervical adenopathy.  ?Skin: ?   General: Skin is warm and dry.  ?Neurological:  ?   General: No focal deficit present.  ?   Mental Status: She is alert and oriented to person, place, and time.  ?Psychiatric:     ?   Mood and Affect: Mood normal.     ?   Behavior: Behavior normal.     ?   Thought Content: Thought content normal.     ?   Judgment: Judgment normal.  ? ? ?Assessment and Plan :  ? ?PDMP not reviewed this encounter. ? ?1. Acute non-recurrent sinusitis, unspecified location   ?2. Allergic rhinitis, unspecified seasonality, unspecified trigger   ? ?Will start empiric treatment for sinusitis with Augmentin.  Recommended supportive care otherwise including the continued use of oral antihistamine. If no improvement in the next 2-3 days, start an oral prednisone to address an allergic rhinitis. Deferred COVID testing due to timeline of her illness. Counseled patient on potential for adverse effects with medications prescribed/recommended today, ER and return-to-clinic precautions discussed, patient verbalized understanding. ? ?  ?Wallis Bamberg, PA-C ?03/06/22 1537 ? ?

## 2022-03-06 NOTE — Telephone Encounter (Signed)
Patient will go to UC.

## 2022-03-06 NOTE — Telephone Encounter (Signed)
Please advise 

## 2022-03-06 NOTE — ED Triage Notes (Signed)
Pt presents with c/o nasal congestion and facial pressure for past week, negative covid test this morning  ?

## 2022-04-01 ENCOUNTER — Encounter: Payer: Self-pay | Admitting: Nurse Practitioner

## 2022-04-02 NOTE — Telephone Encounter (Signed)
Please advise 

## 2022-04-03 NOTE — Telephone Encounter (Signed)
Please advise 

## 2022-04-13 ENCOUNTER — Encounter: Payer: Self-pay | Admitting: Nurse Practitioner

## 2022-04-13 NOTE — Telephone Encounter (Signed)
Called pt no answer °

## 2022-04-28 ENCOUNTER — Other Ambulatory Visit: Payer: Self-pay

## 2022-04-28 DIAGNOSIS — G4700 Insomnia, unspecified: Secondary | ICD-10-CM

## 2022-04-28 MED ORDER — TRAZODONE HCL 50 MG PO TABS: 25.0000 mg | ORAL_TABLET | Freq: Every evening | ORAL | 3 refills | Status: DC | PRN

## 2022-05-26 ENCOUNTER — Ambulatory Visit (INDEPENDENT_AMBULATORY_CARE_PROVIDER_SITE_OTHER): Payer: BC Managed Care – PPO | Admitting: Nurse Practitioner

## 2022-05-26 ENCOUNTER — Encounter: Payer: Self-pay | Admitting: Nurse Practitioner

## 2022-05-26 VITALS — BP 139/80 | HR 75 | Ht 65.0 in | Wt 246.0 lb

## 2022-05-26 DIAGNOSIS — Z1211 Encounter for screening for malignant neoplasm of colon: Secondary | ICD-10-CM

## 2022-05-26 DIAGNOSIS — I10 Essential (primary) hypertension: Secondary | ICD-10-CM | POA: Diagnosis not present

## 2022-05-26 DIAGNOSIS — R42 Dizziness and giddiness: Secondary | ICD-10-CM | POA: Diagnosis not present

## 2022-05-26 MED ORDER — MECLIZINE HCL 12.5 MG PO TABS: 12.5000 mg | ORAL_TABLET | Freq: Three times a day (TID) | ORAL | 0 refills | Status: DC | PRN

## 2022-05-26 MED ORDER — PHENTERMINE HCL 15 MG PO CAPS: 15.0000 mg | ORAL_CAPSULE | ORAL | 0 refills | Status: DC

## 2022-05-26 NOTE — Assessment & Plan Note (Signed)
Symptoms started about a week ago Rx Antivert 12.5 mg 3 times daily as needed Patient referred to ENT per patient's request

## 2022-05-26 NOTE — Assessment & Plan Note (Addendum)
  BP Readings from Last 3 Encounters:  05/26/22 139/80  03/06/22 132/84  02/02/22 120/78  Currently not on medication, reports that her home blood pressure readings has been between systolic of 116-1 20s over 80s Goal is blood pressure less than 140/90 DASH diet advised, engage in regular vigorous exercises at least 150 minutes weekly

## 2022-05-26 NOTE — Assessment & Plan Note (Addendum)
Wt Readings from Last 3 Encounters:  05/26/22 246 lb (111.6 kg)  02/02/22 244 lb (110.7 kg)  12/25/21 240 lb 0.5 oz (108.9 kg)  she was on phentermine in the past,has had gastric sleeve surgery in 2016. Has not been exercising much.  Would like to try phentermine again. Rx phentermine 15 mg capsule daily, side effects discussed with patient we will follow-up in 4 weeks Need to increase intake of whole food consisting mainly vegetables and protein less carbohydrate drinking at least 64 ounces of water daily, engaging in regular vigorous exercises at least 150 minutes weekly discussed importance of portion control also discussed with patient

## 2022-05-26 NOTE — Progress Notes (Addendum)
   Alisha Brock     MRN: [id number]      DOB: 14-Aug-1976   HPI Alisha Brock with past medical history of essential hypertension, morbid obesity, anxiety, bariatric surgery is here for follow up and re-evaluation of chronic medical conditions, medication management.  Vertigo.  Patient stated that has been having dizziness with turning her head when she lays down at night since the past one week. Has itching ears  and yellowish drainage from both ears, saw ENT about 4 years ago for clogged ear ducts. Feels her ears  still clog up sometimes. She has been using alcohol and vinegar once every two weeks.  Pt denies ear pain, tinnitus, fever, chills. States that her husband told her that she is hard of hearing.    Obesity. She has had weight loss surgery in the past , was previously on phentermine as well, reported that she was able to lose weight while taking phentermine.  Reginal Lutes was not covered by her pharmacy, she would like to try phentermine again  Patient is due for colon cancer screening referral sent to GI for colonoscopy    ROS Denies recent fever or chills. Denies sinus pressure, nasal congestion, ear pain or sore throat. Denies chest congestion, productive cough or wheezing. Denies chest pains, palpitations and leg swelling Denies abdominal pain, nausea, vomiting,diarrhea or constipation.   Denies dysuria, frequency, hesitancy or incontinence. Denies joint pain, swelling and limitation in mobility. Denies headaches, seizures, numbness, or tingling. Denies depression, anxiety or insomnia.    PE  BP 139/80 (BP Location: Right Arm, Patient Position: Sitting, Cuff Size: Large)   Pulse 75   Ht 5\' 5"  (1.651 m)   Wt 246 lb (111.6 kg)   LMP 04/08/2022 (Approximate)   SpO2 97%   BMI 40.94 kg/m   Patient alert and oriented and in no cardiopulmonary distress.  HEENT: No facial asymmetry, EOMI,     Neck supple .  Chest: Clear to auscultation bilaterally.  CVS: S1,  S2 no murmurs, no S3.Regular rate.  ABD: Soft non tender.   Ext: No edema  MS: Adequate ROM spine, shoulders, hips and knees.  Psych: Good eye contact, normal affect. Memory intact not anxious or depressed appearing.  CNS: CN 2-12 intact, power,  normal throughout.no focal deficits noted.   Assessment & Plan  Essential hypertension  BP Readings from Last 3 Encounters:  05/26/22 139/80  03/06/22 132/84  02/02/22 120/78  Currently not on medication, reports that her home blood pressure readings has been between systolic of 116-1 20s over 80s Goal is blood pressure less than 140/90 DASH diet advised, engage in regular vigorous exercises at least 150 minutes weekly   Morbid obesity (HCC) Wt Readings from Last 3 Encounters:  05/26/22 246 lb (111.6 kg)  02/02/22 244 lb (110.7 kg)  12/25/21 240 lb 0.5 oz (108.9 kg)  she was on phentermine in the past,has had gastric sleeve surgery in 2016. Has not been exercising much.  Would like to try phentermine again. Rx phentermine 15 mg capsule daily, side effects discussed with patient we will follow-up in 4 weeks Need to increase intake of whole food consisting mainly vegetables and protein less carbohydrate drinking at least 64 ounces of water daily, engaging in regular vigorous exercises at least 150 minutes weekly discussed importance of portion control also discussed with patient   Vertigo Symptoms started about a week ago Rx Antivert 12.5 mg 3 times daily as needed Patient referred to ENT per patient's request

## 2022-05-26 NOTE — Patient Instructions (Addendum)
  Please start taking antivert 12.5mg  three times daily as needed for dizziness.    Please take phentermine 15 mg daily for weight loss management   It is important that you exercise regularly at least 30 minutes 5 times a week.  Think about what you will eat, plan ahead. Choose " clean, green, fresh or frozen" over canned, processed or packaged foods which are more sugary, salty and fatty. 70 to 75% of food eaten should be vegetables and fruit. Three meals at set times with snacks allowed between meals, but they must be fruit or vegetables. Aim to eat over a 12 hour period , example 7 am to 7 pm, and STOP after  your last meal of the day. Drink water,generally about 64 ounces per day, no other drink is as healthy. Fruit juice is best enjoyed in a healthy way, by EATING the fruit.  Thanks for choosing [facility], we consider it a privelige to serve you.

## 2022-05-27 ENCOUNTER — Encounter: Payer: Self-pay | Admitting: *Deleted

## 2022-05-27 DIAGNOSIS — Z6841 Body Mass Index (BMI) 40.0 and over, adult: Secondary | ICD-10-CM | POA: Diagnosis not present

## 2022-05-27 DIAGNOSIS — Z01419 Encounter for gynecological examination (general) (routine) without abnormal findings: Secondary | ICD-10-CM | POA: Diagnosis not present

## 2022-05-27 DIAGNOSIS — Z1231 Encounter for screening mammogram for malignant neoplasm of breast: Secondary | ICD-10-CM | POA: Diagnosis not present

## 2022-06-25 ENCOUNTER — Ambulatory Visit: Payer: BC Managed Care – PPO | Admitting: Nurse Practitioner

## 2022-06-29 ENCOUNTER — Ambulatory Visit: Payer: BC Managed Care – PPO | Admitting: Nurse Practitioner

## 2022-07-16 ENCOUNTER — Other Ambulatory Visit: Payer: Self-pay | Admitting: Nurse Practitioner

## 2022-07-16 DIAGNOSIS — G4700 Insomnia, unspecified: Secondary | ICD-10-CM

## 2022-08-19 ENCOUNTER — Ambulatory Visit
Admission: EM | Admit: 2022-08-19 | Discharge: 2022-08-19 | Disposition: A | Discharge status: Home / Self Care | Payer: BC Managed Care – PPO | Attending: Nurse Practitioner | Admitting: Nurse Practitioner

## 2022-08-19 ENCOUNTER — Encounter: Payer: Self-pay | Admitting: Emergency Medicine

## 2022-08-19 DIAGNOSIS — M79672 Pain in left foot: Secondary | ICD-10-CM | POA: Diagnosis not present

## 2022-08-19 MED ORDER — SULFAMETHOXAZOLE-TRIMETHOPRIM 800-160 MG PO TABS: 1.0000 | ORAL_TABLET | Freq: Two times a day (BID) | ORAL | 0 refills | Status: AC

## 2022-08-19 NOTE — ED Triage Notes (Signed)
States she slid across a wood floor and thinks she has a splinter in the bottom of her left foot.

## 2022-08-19 NOTE — ED Provider Notes (Signed)
[facility]    CSN: [id number] Arrival date & time: 08/19/22  1540      History   Chief Complaint No chief complaint on file.   HPI Alisha Brock is a 46 y.o. female.   Patient presents for foot injury that occurred earlier today.  Reports she was sliding across her hardwood floor to answer the phone earlier today and got a splinter in the bottom of her left foot.  She reports it went through the bottom of her sock.  She is not wearing shoes at the time.  Reports since injury, the bottom of her foot has been painful.  She remove the splinter on her own, but fears that there is left of her debris in the bottom of her foot.  She denies any swelling, numbness/tingling in her toes.  She is able to fully move her foot, although it is tender on the bottom.    Past Medical History:  Diagnosis Date   Allergy    Anemia    Herniated lumbar intervertebral disc    Obesity    Sleep apnea     Patient Active Problem List   Diagnosis Date Noted   Vertigo 05/26/2022   Refused influenza vaccine 02/02/2022   Annual physical exam 12/25/2021   Anxiety 12/25/2021   Need for diphtheria-tetanus-pertussis (Tdap) vaccine 12/25/2021   Insomnia 07/22/2021   Morbid obesity (HCC) 08/08/2020   Environmental and seasonal allergies 05/09/2020   Essential hypertension 05/09/2020   H/O bariatric surgery 05/09/2020   Other fatigue 05/09/2020   Depression 05/09/2020    Past Surgical History:  Procedure Laterality Date   BARIATRIC SURGERY  11/2015   CESAREAN SECTION  August 2004   DILATION AND EVACUATION N/A 08/12/2018   Procedure: DILATATION AND EVACUATION;  Surgeon: Carrington ClampHorvath, Michelle, MD;  Location: [facility];  Service: Gynecology;  Laterality: N/A;   DILATION AND EVACUATION N/A 02/10/2019   Procedure: DILATATION AND EVACUATION;  Surgeon: Carrington ClampHorvath, Michelle, MD;  [facility];  Service: Gynecology;  Laterality: N/A;   HERNIA REPAIR Bilateral    Inguinal  Hernia Repair at age 53.   TONSILLECTOMY     WISDOM TOOTH EXTRACTION  2010    OB History   No obstetric history on file.      Home Medications    Prior to Admission medications   Medication Sig Start Date End Date Taking? Authorizing Provider  sulfamethoxazole-trimethoprim (BACTRIM DS) 800-160 MG tablet Take 1 tablet by mouth 2 (two) times daily for 7 days. 08/19/22 08/26/22 Yes Valentino NoseMartinez, Kaziyah Parkison A, NP  Cyanocobalamin (VITAMIN B 12 PO) Take 1 tablet by mouth daily.    [provider]  Fe Bisgly-Vit C-Vit B12-FA (GENTLE IRON PO) Take 1 tablet by mouth daily.    [provider]  JOLESSA 0.15-0.03 MG tablet Take 1 tablet by mouth daily. 02/27/20   [provider]  levocetirizine (XYZAL) 5 MG tablet TAKE 1 TABLET(5 MG) BY MOUTH EVERY EVENING 10/20/21   Heather RobertsGray, Joseph M, NP  meclizine (ANTIVERT) 12.5 MG tablet Take 1 tablet (12.5 mg total) by mouth 3 (three) times daily as needed for dizziness. 05/26/22   Donell BeersPaseda, Folashade R, FNP  phentermine 15 MG capsule Take 1 capsule (15 mg total) by mouth every morning. 05/26/22   Donell BeersPaseda, Folashade R, FNP  traZODone (DESYREL) 50 MG tablet TAKE 1/2 TO 1 TABLET(25 TO 50 MG) BY MOUTH AT BEDTIME AS NEEDED FOR SLEEP 07/16/22   Donell BeersPaseda, Folashade R, FNP    Family History  Family History  Problem Relation Age of Onset   Hypertension Mother    Hypercholesterolemia Father    Glaucoma Father    Deep vein thrombosis Father    Drug abuse Sister    Bipolar disorder Sister    Arthritis Brother    Colon polyps Maternal Aunt    Colon cancer Paternal Aunt    Heart disease Paternal Uncle    Diabetes Mellitus II Paternal Grandmother    Diabetes type II Paternal Grandfather    Heart disease Paternal Grandfather    Breast cancer Neg Hx    Lung cancer Neg Hx    Cervical cancer Neg Hx     Social History Social History   Tobacco Use   Smoking status: Never   Smokeless tobacco: Never  Substance Use Topics   Alcohol use: Yes    Comment: occ    Drug use: No     Allergies   Lisinopril   Review of Systems Review of Systems Per HPI  Physical Exam Triage Vital Signs ED Triage Vitals  Enc Vitals Group     BP 08/19/22 1554 (!) 141/82     Pulse Rate 08/19/22 1554 95     Resp 08/19/22 1554 18     Temp 08/19/22 1554 99 F (37.2 C)     Temp Source 08/19/22 1554 Oral     SpO2 08/19/22 1554 97 %     Weight --      Height --      Head Circumference --      Peak Flow --      Pain Score 08/19/22 1555 7     Pain Loc --      Pain Edu? --      Excl. in GC? --    No data found.  Updated Vital Signs BP (!) 141/82 (BP Location: Right Arm)   Pulse 95   Temp 99 F (37.2 C) (Oral)   Resp 18   LMP 07/29/2022 (Approximate)   SpO2 97%   Visual Acuity Right Eye Distance:   Left Eye Distance:   Bilateral Distance:    Right Eye Near:   Left Eye Near:    Bilateral Near:     Physical Exam Vitals and nursing note reviewed.  Constitutional:      General: She is not in acute distress.    Appearance: Normal appearance. She is not toxic-appearing.  HENT:     Head: Normocephalic and atraumatic.     Mouth/Throat:     Mouth: Mucous membranes are moist.     Pharynx: Oropharynx is clear.  Pulmonary:     Effort: Pulmonary effort is normal. No respiratory distress.  Musculoskeletal:       Feet:  Feet:     Comments: Avulsed skin noted to the sole of the left foot in the area marked; there appears to be a darkened area under the avulsion area.  The foot is tender to palpation in the area marked as well as more proximally.  No obvious redness, swelling, deformity.  Full range of motion.  Bleeding is well controlled. Skin:    General: Skin is warm and dry.     Capillary Refill: Capillary refill takes less than 2 seconds.     Coloration: Skin is not jaundiced or pale.     Findings: No erythema.  Neurological:     Mental Status: She is alert and oriented to person, place, and time.  Psychiatric:        Behavior: Behavior  is  cooperative.      UC Treatments / Results  Labs (all labs ordered are listed, but only abnormal results are displayed) Labs Reviewed - No data to display  EKG   Radiology No results found.  Procedures Foreign Body Removal  Date/Time: 08/19/2022 5:32 PM  Performed by: Valentino Nose, NP Authorized by: Valentino Nose, NP   Consent:    Consent obtained:  Verbal   Consent given by:  Patient   Risks, benefits, and alternatives were discussed: yes     Risks discussed:  Bleeding, infection, pain, worsening of condition and incomplete removal   Alternatives discussed:  Alternative treatment and observation Universal protocol:    Procedure explained and questions answered to patient or proxy's satisfaction: yes     Patient identity confirmed:  Verbally with patient Location:    Location:  Foot   Foot location:  L sole   Depth:  Intradermal Pre-procedure details:    Imaging:  None   Neurovascular status: intact   Anesthesia:    Anesthesia method:  Topical application   Topical anesthesia: Freeze spray. Procedure type:    Procedure complexity:  Simple Procedure details:    Removal mechanism:  Irrigation and forceps   Foreign bodies recovered:  None Post-procedure details:    Neurovascular status: intact     Skin closure:  None   Dressing:  Non-adherent dressing   Procedure completion:  Tolerated with difficulty  (including critical care time)  Medications Ordered in UC Medications - No data to display  Initial Impression / Assessment and Plan / UC Course  I have reviewed the triage vital signs and the nursing notes.  Pertinent labs & imaging results that were available during my care of the patient were reviewed by me and considered in my medical decision making (see chart for details).    Patient is a very pleasant, well-appearing 46 year old female presenting for possible foreign body in the left foot after a left foot injury.  In triage, she is  normotensive, not tachycardic, oxygenating well on room air, not tachypneic, and not tachycardic.  Foreign body removal attempted as above.  I was unable to visualize foreign body after using forceps to slightly pull the avulsed skin back.  I used approximately 3 cc of normal saline to flush under the avulsed skin; no foreign object removed.  Avulsed skin placed back over dermis and dressing applied.  Wound care discussed.  Suspect dark-colored skin under avulsed area may be dried blood.  We did discuss that if it is a foreign body, would likely grow out of skin on its own over time.  Also discussed with patient low diagnostic capability of x-ray imaging.  Recommended starting Bactrim DS twice daily for 7 days for prevention of infection.  Tdap has been updated recently.  Recommended close follow-up with podiatry if symptoms persist or worsen.  Note given for work.  The patient was given the opportunity to ask questions.  All questions answered to their satisfaction.  The patient is in agreement to this plan.   Final Clinical Impressions(s) / UC Diagnoses   Final diagnoses:  Left foot pain     Discharge Instructions      - I am unable to see any foreign body in the bottom of your foot today - Please take the Bactrim DS twice daily for 7 days to prevent infection - Continue to keep the area clean and dry - Follow up with Podiatry if your symptoms persist or worsen  with treatment    ED Prescriptions     Medication Sig Dispense Auth. Provider   sulfamethoxazole-trimethoprim (BACTRIM DS) 800-160 MG tablet Take 1 tablet by mouth 2 (two) times daily for 7 days. 14 tablet Eulogio Bear, NP      PDMP not reviewed this encounter.   Eulogio Bear, NP 08/19/22 [phone]

## 2022-08-19 NOTE — Discharge Instructions (Addendum)
-   I am unable to see any foreign body in the bottom of your foot today - Please take the Bactrim DS twice daily for 7 days to prevent infection - Continue to keep the area clean and dry - Follow up with Podiatry if your symptoms persist or worsen with treatment

## 2022-08-19 NOTE — ED Triage Notes (Signed)
States last tetanus shot was in January.

## 2022-08-20 ENCOUNTER — Other Ambulatory Visit: Payer: Self-pay | Admitting: Podiatry

## 2022-08-20 ENCOUNTER — Ambulatory Visit (INDEPENDENT_AMBULATORY_CARE_PROVIDER_SITE_OTHER): Payer: BC Managed Care – PPO

## 2022-08-20 ENCOUNTER — Ambulatory Visit: Payer: BC Managed Care – PPO | Admitting: Podiatry

## 2022-08-20 DIAGNOSIS — S90852A Superficial foreign body, left foot, initial encounter: Secondary | ICD-10-CM | POA: Diagnosis not present

## 2022-08-20 DIAGNOSIS — M795 Residual foreign body in soft tissue: Secondary | ICD-10-CM | POA: Diagnosis not present

## 2022-08-20 DIAGNOSIS — M79672 Pain in left foot: Secondary | ICD-10-CM

## 2022-08-25 NOTE — Progress Notes (Signed)
Subjective:  Patient ID: Alisha Brock, female    DOB: Aug 11, 1976,  MRN: [id number]  Chief Complaint  Patient presents with   Foot Pain    Left foot possible splinter     46 y.o. female presents with the above complaint.  Patient presents with complaint of splinter to the left foot.  Patient states that morning at an angle from distal to proximal.  Patient states that she noticed tiny piece missing from the staircase where the splinter went in from.  She states it hurts with ambulation hurts with pressure she has not seen and was prior to seeing me.  She is not a diabetic.  She wanted to get it evaluated she would like to have it removed.  She went to urgent care but was unable to have it removed.  She is here to see if there is anything that can be done for this.  She denies any other acute issues   Review of Systems: Negative except as noted in the HPI. Denies N/V/F/Ch.  Past Medical History:  Diagnosis Date   Allergy    Anemia    Herniated lumbar intervertebral disc    Obesity    Sleep apnea     Current Outpatient Medications:    Cyanocobalamin (VITAMIN B 12 PO), Take 1 tablet by mouth daily., Disp: , Rfl:    Fe Bisgly-Vit C-Vit B12-FA (GENTLE IRON PO), Take 1 tablet by mouth daily., Disp: , Rfl:    JOLESSA 0.15-0.03 MG tablet, Take 1 tablet by mouth daily., Disp: , Rfl:    levocetirizine (XYZAL) 5 MG tablet, TAKE 1 TABLET(5 MG) BY MOUTH EVERY EVENING, Disp: 90 tablet, Rfl: 1   meclizine (ANTIVERT) 12.5 MG tablet, Take 1 tablet (12.5 mg total) by mouth 3 (three) times daily as needed for dizziness., Disp: 30 tablet, Rfl: 0   phentermine 15 MG capsule, Take 1 capsule (15 mg total) by mouth every morning., Disp: 30 capsule, Rfl: 0   sulfamethoxazole-trimethoprim (BACTRIM DS) 800-160 MG tablet, Take 1 tablet by mouth 2 (two) times daily for 7 days., Disp: 14 tablet, Rfl: 0   traZODone (DESYREL) 50 MG tablet, TAKE 1/2 TO 1 TABLET(25 TO 50 MG) BY MOUTH AT BEDTIME AS NEEDED  FOR SLEEP, Disp: 90 tablet, Rfl: 1  Social History   Tobacco Use  Smoking Status Never  Smokeless Tobacco Never    Allergies  Allergen Reactions   Lisinopril Cough   Objective:  There were no vitals filed for this visit. There is no height or weight on file to calculate BMI. Constitutional Well developed. Well nourished.  Vascular Dorsalis pedis pulses palpable bilaterally. Posterior tibial pulses palpable bilaterally. Capillary refill normal to all digits.  No cyanosis or clubbing noted. Pedal hair growth normal.  Neurologic Normal speech. Oriented to person, place, and time. Epicritic sensation to light touch grossly present bilaterally.  Dermatologic Entry point for splinter noted to the left forefoot.  Appears to going from distal to proximal ankle.  Per patient the missing piece is very small.  No ankle signs of infection noted.  No purulent drainage noted no redness noted.  Orthopedic: Normal joint ROM without pain or crepitus bilaterally. No visible deformities. No bony tenderness.   Radiographs: 3 views of skeletally mature adult left foot: No signs of foreign body noted.  The foreign body is wound which would not be radiopaque. Assessment:   1. Foreign body of skin of plantar aspect of left foot    Plan:  Patient was evaluated  and treated and all questions answered.  Left foot foreign body (splinter/wood) -All questions and concerns were discussed with the patient in extensive detail.   -Remove the foreign body Skin was prepped in standard technique with Betadine.  One-to-one mixture of 1% lidocaine plain half percent Marcaine plain was injected circumferentially in V-block fashion 3 cc.  Chisel blade and a handle along with pickup was used to appreciate and try to remove the splinter.  At this time I was unable to appreciate any splinter.  Given the size of it I am not hopeful that it will be removed.  The wound site was dressed with triple antibiotic 4 x 4 gauze  Kerlix and Ace bandage.  I have asked the patient to keep it covered with Neosporin and a Band-Aid until healing.  -I discussed with the patient at this point the splinter may wall itself fall off or the body will naturally try to spit it through the entry point.  I discussed this with the patient we will give it good 6 to 8 weeks to reepithelialized and see if it still bothersome.  Patient agrees with the plan.   No follow-ups on file.

## 2022-09-08 DIAGNOSIS — L719 Rosacea, unspecified: Secondary | ICD-10-CM | POA: Diagnosis not present

## 2022-09-08 DIAGNOSIS — L723 Sebaceous cyst: Secondary | ICD-10-CM | POA: Diagnosis not present

## 2022-09-25 DIAGNOSIS — D2372 Other benign neoplasm of skin of left lower limb, including hip: Secondary | ICD-10-CM | POA: Diagnosis not present

## 2022-09-25 DIAGNOSIS — L723 Sebaceous cyst: Secondary | ICD-10-CM | POA: Diagnosis not present

## 2022-10-01 ENCOUNTER — Ambulatory Visit (INDEPENDENT_AMBULATORY_CARE_PROVIDER_SITE_OTHER): Payer: BC Managed Care – PPO | Admitting: Podiatry

## 2022-10-01 DIAGNOSIS — S90852A Superficial foreign body, left foot, initial encounter: Secondary | ICD-10-CM | POA: Diagnosis not present

## 2022-10-01 NOTE — Progress Notes (Signed)
  Subjective:  Patient ID: Alisha Brock, female    DOB: 05-Sep-1976,  MRN: [id number]  Chief Complaint  Patient presents with   Foot Pain    Pt stated that her foot is healed she does at times feel like there is something in it when she is walking     46 y.o. female presents with the above complaint.  Patient presents for follow-up of left splinter removal.  She states she is doing a lot better.  She still notices something in it however it is not bothersome.  No pain.  Has completely healed.   Review of Systems: Negative except as noted in the HPI. Denies N/V/F/Ch.  Past Medical History:  Diagnosis Date   Allergy    Anemia    Herniated lumbar intervertebral disc    Obesity    Sleep apnea     Current Outpatient Medications:    Cyanocobalamin (VITAMIN B 12 PO), Take 1 tablet by mouth daily., Disp: , Rfl:    Fe Bisgly-Vit C-Vit B12-FA (GENTLE IRON PO), Take 1 tablet by mouth daily., Disp: , Rfl:    JOLESSA 0.15-0.03 MG tablet, Take 1 tablet by mouth daily., Disp: , Rfl:    levocetirizine (XYZAL) 5 MG tablet, TAKE 1 TABLET(5 MG) BY MOUTH EVERY EVENING, Disp: 90 tablet, Rfl: 1   meclizine (ANTIVERT) 12.5 MG tablet, Take 1 tablet (12.5 mg total) by mouth 3 (three) times daily as needed for dizziness., Disp: 30 tablet, Rfl: 0   phentermine 15 MG capsule, Take 1 capsule (15 mg total) by mouth every morning., Disp: 30 capsule, Rfl: 0   traZODone (DESYREL) 50 MG tablet, TAKE 1/2 TO 1 TABLET(25 TO 50 MG) BY MOUTH AT BEDTIME AS NEEDED FOR SLEEP, Disp: 90 tablet, Rfl: 1  Social History   Tobacco Use  Smoking Status Never  Smokeless Tobacco Never    Allergies  Allergen Reactions   Lisinopril Cough   Objective:  There were no vitals filed for this visit. There is no height or weight on file to calculate BMI. Constitutional Well developed. Well nourished.  Vascular Dorsalis pedis pulses palpable bilaterally. Posterior tibial pulses palpable bilaterally. Capillary refill  normal to all digits.  No cyanosis or clubbing noted. Pedal hair growth normal.  Neurologic Normal speech. Oriented to person, place, and time. Epicritic sensation to light touch grossly present bilaterally.  Dermatologic No further entry point noted no pain on palpation of the left forefoot.  No purulent drainage or signs of infection noted  Orthopedic: Normal joint ROM without pain or crepitus bilaterally. No visible deformities. No bony tenderness.   Radiographs: 3 views of skeletally mature adult left foot: No signs of foreign body noted.  The foreign body is wound which would not be radiopaque. Assessment:   1. Foreign body of skin of plantar aspect of left foot     Plan:  Patient was evaluated and treated and all questions answered.  Left foot foreign body (splinter/wood) -Skin has completely reepithelialized.  She still has a little bit of weird feeling when she is ambulating barefooted on hard surfaces however I discussed with the patient that it can take a little bit of time to completely heal from the inside.  She states understanding at this time I do not see any entry point if any foot and ankle issues arise in future advised her to come back and see me.  She states understanding.   No follow-ups on file.

## 2022-10-08 DIAGNOSIS — D239 Other benign neoplasm of skin, unspecified: Secondary | ICD-10-CM | POA: Diagnosis not present

## 2022-10-19 ENCOUNTER — Telehealth: Payer: BC Managed Care – PPO | Admitting: Internal Medicine

## 2022-10-20 ENCOUNTER — Encounter: Payer: Self-pay | Admitting: Internal Medicine

## 2022-10-20 ENCOUNTER — Ambulatory Visit (INDEPENDENT_AMBULATORY_CARE_PROVIDER_SITE_OTHER): Payer: BC Managed Care – PPO | Admitting: Internal Medicine

## 2022-10-20 DIAGNOSIS — J3089 Other allergic rhinitis: Secondary | ICD-10-CM

## 2022-10-20 MED ORDER — LEVOCETIRIZINE DIHYDROCHLORIDE 5 MG PO TABS: ORAL_TABLET | ORAL | 1 refills | Status: DC

## 2022-10-20 NOTE — Patient Instructions (Signed)
Thank you for trusting me with your care. To recap, today we discussed the following:  Refilled medication - levocetirizine (XYZAL) 5 MG tablet; TAKE 1 TABLET(5 MG) BY MOUTH EVERY EVENING  Dispense: 90 tablet; Refill: 1

## 2022-10-20 NOTE — Assessment & Plan Note (Signed)
Seasonal allergies well controlled on Xyzal.  Refilled as below.  - levocetirizine (XYZAL) 5 MG tablet; TAKE 1 TABLET(5 MG) BY MOUTH EVERY EVENING  Dispense: 90 tablet; Refill: 1

## 2022-10-20 NOTE — Progress Notes (Signed)
     This is a telephone encounter between Alisha Brock and Alisha Brock on 10/20/2022 for seasonal allergies. The visit was conducted with the patient located at home and Alisha Brock at [facility]. The patient's identity was confirmed using their DOB and current address. The patient has consented to being evaluated through a telephone encounter and understands the associated risks (an examination cannot be done and the patient may need to come in for an appointment) / benefits (allows the patient to remain at home, decreasing exposure to coronavirus).    CC: seasonal allergies  HPI:Ms.Alisha Brock is a 46 y.o. female who presents for evaluation of seasonal allergies. For the details of today's visit, please refer to the assessment and plan.    Past Medical History:  Diagnosis Date   Allergy    Anemia    Herniated lumbar intervertebral disc    Obesity    Sleep apnea     Assessment & Plan:   Environmental and seasonal allergies Seasonal allergies well controlled on Xyzal.  Refilled as below.  - levocetirizine (XYZAL) 5 MG tablet; TAKE 1 TABLET(5 MG) BY MOUTH EVERY EVENING  Dispense: 90 tablet; Refill: 1     Time:   Today, I have spent 8 minutes reviewing the chart, including problem list, medications, and with the patient with telehealth technology discussing the above problems.  Alisha Dy, MD

## 2022-11-05 DIAGNOSIS — D485 Neoplasm of uncertain behavior of skin: Secondary | ICD-10-CM | POA: Diagnosis not present

## 2023-01-07 IMAGING — MG MM DIGITAL DIAGNOSTIC UNILAT*L* W/ TOMO W/ CAD
6 series · 6 of 18 positions shown · non-contrast
Comparison: Previous exam(s).

CLINICAL DATA: Patient recalled from screening for left breast
asymmetry.

EXAM:
DIGITAL DIAGNOSTIC UNILATERAL LEFT MAMMOGRAM WITH TOMOSYNTHESIS AND
CAD
TECHNIQUE: Left digital diagnostic mammography and breast tomosynthesis was
performed. The images were evaluated with computer-aided detection.

[L MLO synth-2D]
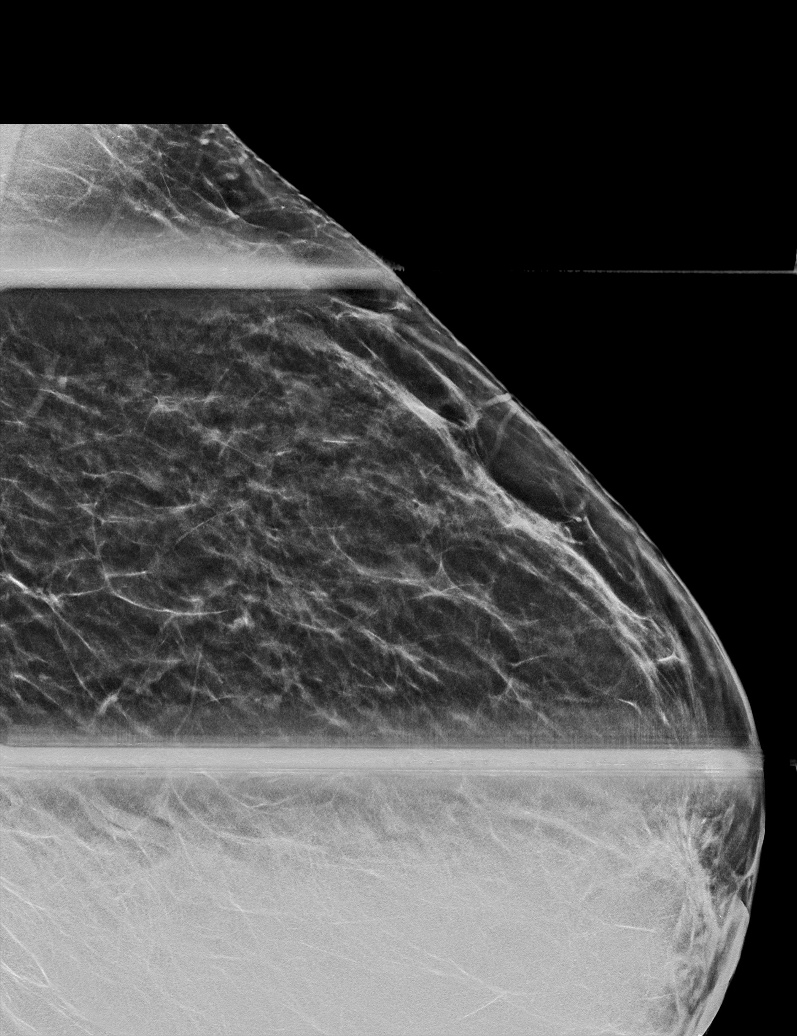

[L CC synth-2D]
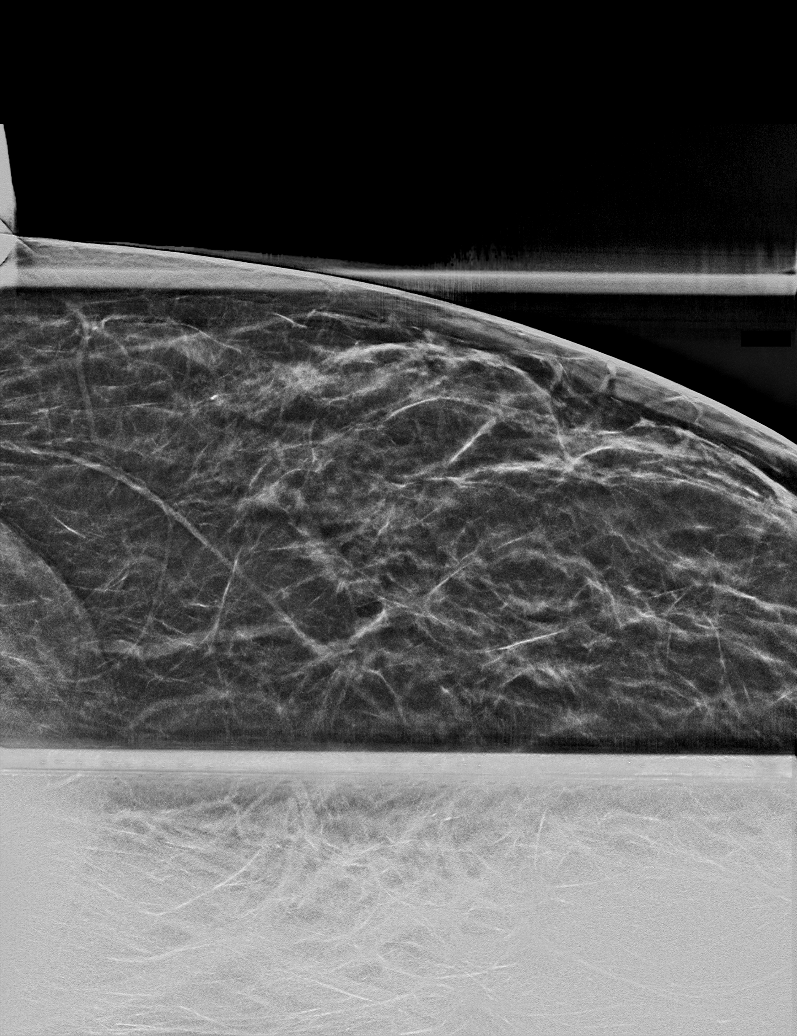

[L ML synth-2D]
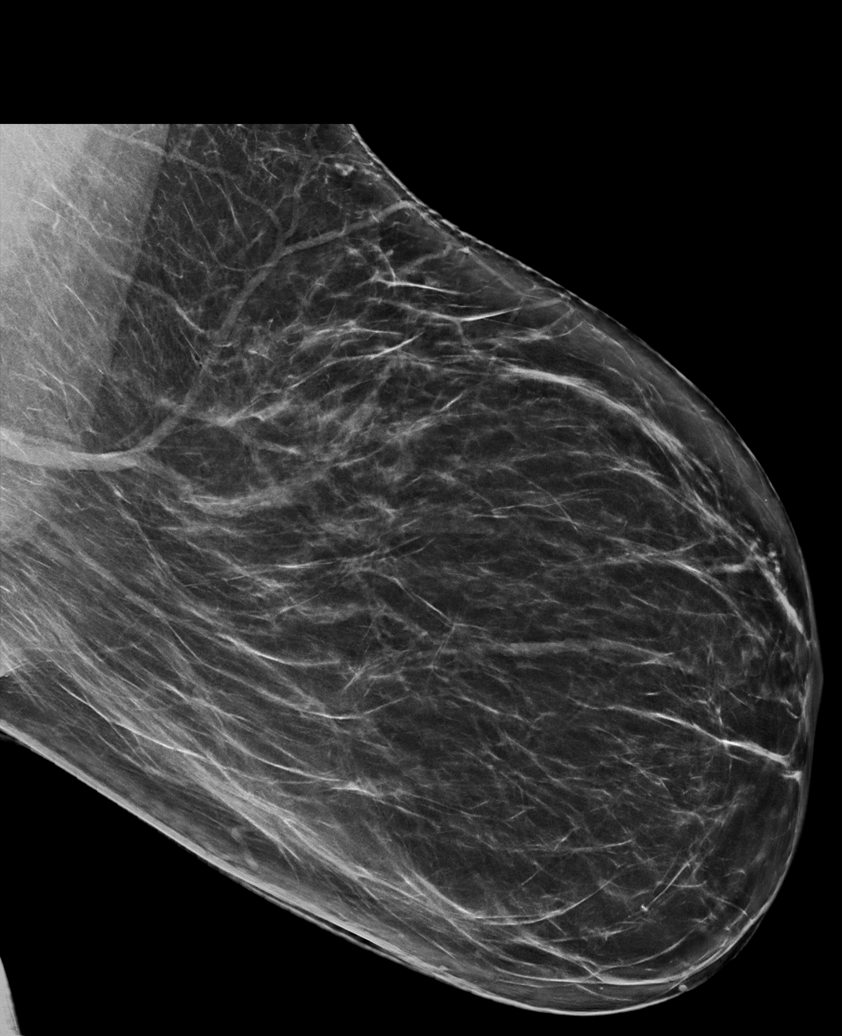

[L ML tomo · tomo slice 41/81.0]
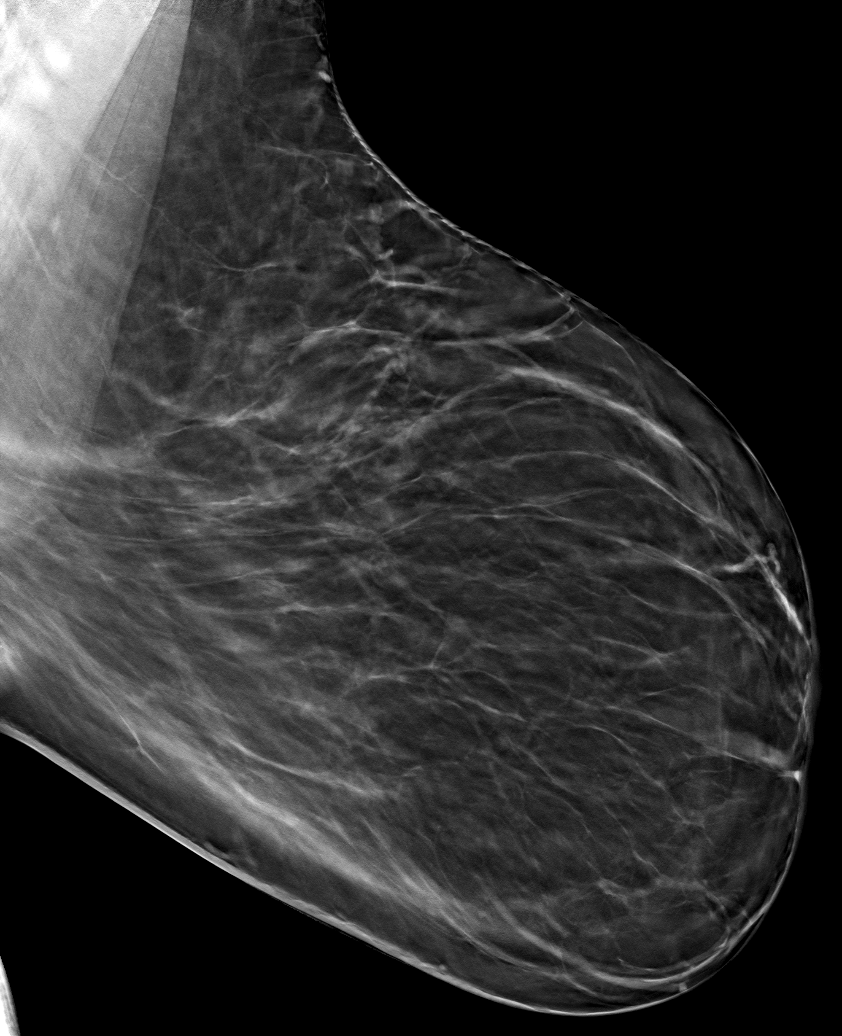

[L MLO tomo · tomo slice 25/50.0]
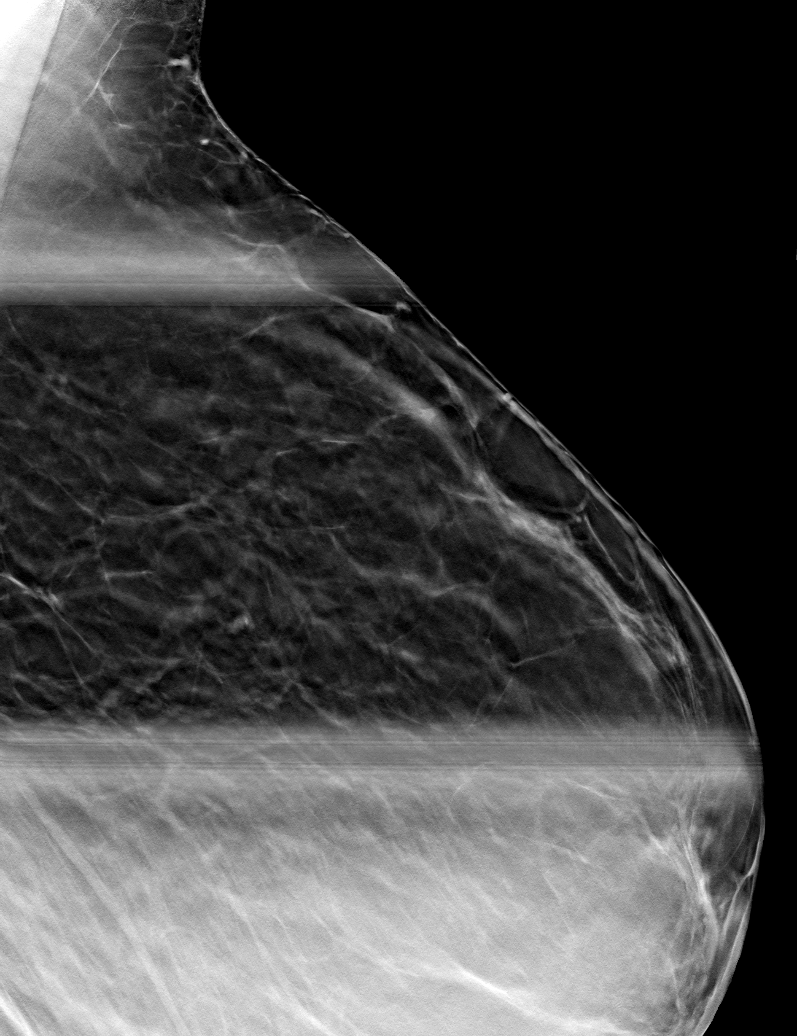

[L CC tomo · tomo slice 27/54.0]
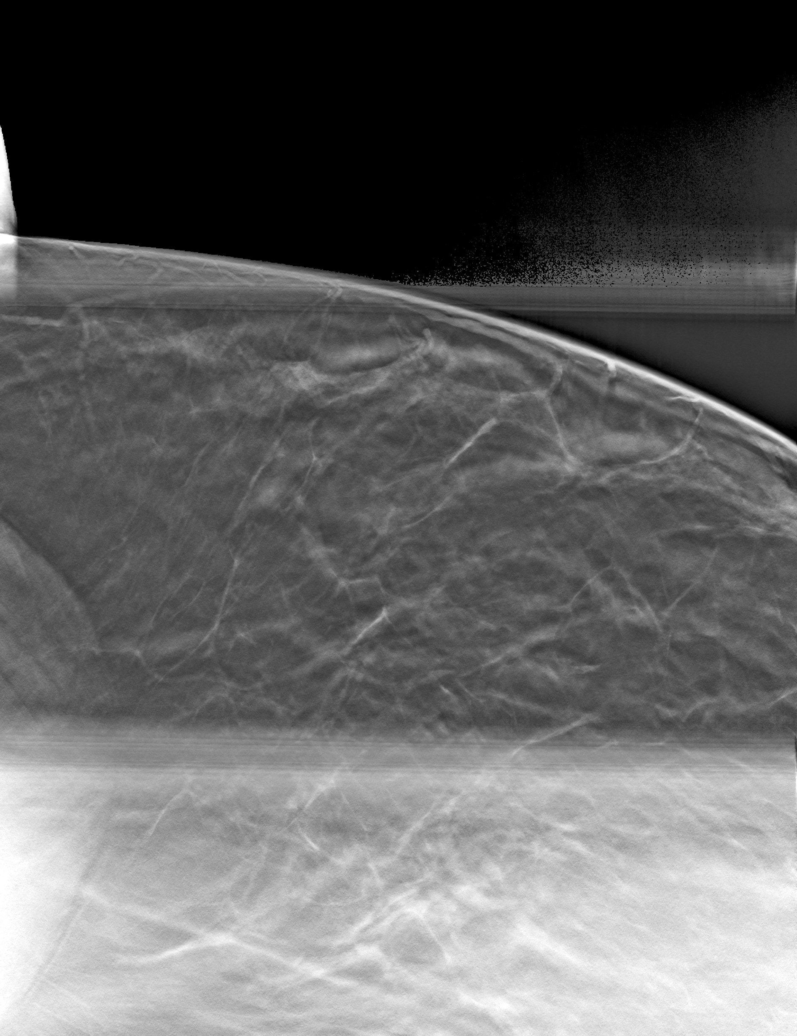

[6 of 18 positions shown; findings below may reference images not displayed]

ACR Breast Density Category c: The breast tissue is heterogeneously
dense, which may obscure small masses.
FINDINGS: Questioned asymmetry within the outer left breast resolved with
additional imaging compatible with dense overlapping fibroglandular
tissue.
IMPRESSION: No mammographic evidence for malignancy.

RECOMMENDATION:
Screening mammogram in one year.(Code:U1-G-LBW)

I have discussed the findings and recommendations with the patient.
If applicable, a reminder letter will be sent to the patient
regarding the next appointment.

BI-RADS CATEGORY  1: Negative.

## 2023-04-09 ENCOUNTER — Telehealth: Payer: Self-pay | Admitting: Internal Medicine

## 2023-04-09 ENCOUNTER — Other Ambulatory Visit: Payer: Self-pay

## 2023-04-09 DIAGNOSIS — J3089 Other allergic rhinitis: Secondary | ICD-10-CM

## 2023-04-09 MED ORDER — LEVOCETIRIZINE DIHYDROCHLORIDE 5 MG PO TABS: ORAL_TABLET | ORAL | 1 refills | Status: DC

## 2023-04-09 NOTE — Telephone Encounter (Signed)
Refills sent

## 2023-04-09 NOTE — Telephone Encounter (Signed)
Med refill levocetirizine (XYZAL) 5 MG tablet [[id number]]   [facility] [address] [place]

## 2023-04-10 ENCOUNTER — Other Ambulatory Visit: Payer: Self-pay | Admitting: Nurse Practitioner

## 2023-04-10 DIAGNOSIS — G4700 Insomnia, unspecified: Secondary | ICD-10-CM

## 2023-06-25 ENCOUNTER — Encounter: Payer: BC Managed Care – PPO | Admitting: Family Medicine

## 2023-07-08 DIAGNOSIS — Z01419 Encounter for gynecological examination (general) (routine) without abnormal findings: Secondary | ICD-10-CM | POA: Diagnosis not present

## 2023-07-08 DIAGNOSIS — Z6841 Body Mass Index (BMI) 40.0 and over, adult: Secondary | ICD-10-CM | POA: Diagnosis not present

## 2023-07-08 DIAGNOSIS — Z1231 Encounter for screening mammogram for malignant neoplasm of breast: Secondary | ICD-10-CM | POA: Diagnosis not present

## 2023-08-13 ENCOUNTER — Encounter: Payer: Self-pay | Admitting: Family Medicine

## 2023-08-13 ENCOUNTER — Ambulatory Visit (INDEPENDENT_AMBULATORY_CARE_PROVIDER_SITE_OTHER): Payer: BC Managed Care – PPO | Admitting: Family Medicine

## 2023-08-13 VITALS — BP 130/85 | HR 109 | Ht 65.0 in | Wt 258.1 lb

## 2023-08-13 DIAGNOSIS — E559 Vitamin D deficiency, unspecified: Secondary | ICD-10-CM

## 2023-08-13 DIAGNOSIS — R7301 Impaired fasting glucose: Secondary | ICD-10-CM

## 2023-08-13 DIAGNOSIS — E7849 Other hyperlipidemia: Secondary | ICD-10-CM

## 2023-08-13 DIAGNOSIS — Z0001 Encounter for general adult medical examination with abnormal findings: Secondary | ICD-10-CM | POA: Diagnosis not present

## 2023-08-13 DIAGNOSIS — E038 Other specified hypothyroidism: Secondary | ICD-10-CM

## 2023-08-13 DIAGNOSIS — Z0000 Encounter for general adult medical examination without abnormal findings: Secondary | ICD-10-CM

## 2023-08-13 DIAGNOSIS — Z1211 Encounter for screening for malignant neoplasm of colon: Secondary | ICD-10-CM

## 2023-08-13 NOTE — Progress Notes (Unsigned)
Complete physical exam  Patient: Alisha Brock   DOB: 18-Apr-1976   47 y.o. Female  MRN: [id number]  Subjective:    Chief Complaint  Patient presents with   Annual Exam    CPE no concerns    Alisha Brock is a 47 y.o. female who presents today for a complete physical exam. She reports consuming a general diet. The patient does not participate in regular exercise at present. She generally feels well. She reports sleeping well. She does not have additional problems to discuss today.    Most recent fall risk assessment:    08/13/2023    3:31 PM  Fall Risk   Falls in the past year? 0  Number falls in past yr: 0  Injury with Fall? 0  Risk for fall due to : No Fall Risks  Follow up Falls evaluation completed     Most recent depression screenings:    08/13/2023    3:31 PM 10/20/2022   11:56 AM  PHQ 2/9 Scores  PHQ - 2 Score 0 0    Dental: No current dental problems and Last dental visit: march 2024  Patient Active Problem List   Diagnosis Date Noted   Vertigo 05/26/2022   Refused influenza vaccine 02/02/2022   Annual physical exam 12/25/2021   Anxiety 12/25/2021   Need for diphtheria-tetanus-pertussis (Tdap) vaccine 12/25/2021   Insomnia 07/22/2021   Morbid obesity (HCC) 08/08/2020   Environmental and seasonal allergies 05/09/2020   Essential hypertension 05/09/2020   H/O bariatric surgery 05/09/2020   Other fatigue 05/09/2020   Depression 05/09/2020   Past Medical History:  Diagnosis Date   Allergy    Anemia    Herniated lumbar intervertebral disc    Obesity    Sleep apnea    Past Surgical History:  Procedure Laterality Date   BARIATRIC SURGERY  11/2015   CESAREAN SECTION  August 2004   DILATION AND EVACUATION N/A 08/12/2018   Procedure: DILATATION AND EVACUATION;  Surgeon: Carrington Clamp, MD;  Location: [facility];  Service: Gynecology;  Laterality: N/A;   DILATION AND EVACUATION N/A 02/10/2019   Procedure: DILATATION AND EVACUATION;   Surgeon: Carrington Clamp, MD;  Location: [facility];  Service: Gynecology;  Laterality: N/A;   HERNIA REPAIR Bilateral    Inguinal Hernia Repair at age 32.   TONSILLECTOMY     WISDOM TOOTH EXTRACTION  2010   Social History   Tobacco Use   Smoking status: Never   Smokeless tobacco: Never  Substance Use Topics   Alcohol use: Yes    Comment: occ   Drug use: No   Social History   Socioeconomic History   Marital status: Married    Spouse name: Ryan   Number of children: 1   Years of education: Not on file   Highest education level: Some college, no degree  Occupational History   Not on file  Tobacco Use   Smoking status: Never   Smokeless tobacco: Never  Substance and Sexual Activity   Alcohol use: Yes    Comment: occ   Drug use: No   Sexual activity: Yes    Birth control/protection: Pill  Other Topics Concern   Not on file  Social History Narrative   Lives with Alycia Rossetti husband of 12 years   Son Ari-19 in August      Enjoys: bike riding, travel, spend time with family      Diet: eats all food groups  Wears seat belt    Does not use phone while driving, hands free   Smoke detectors at home    Weapons in lock box   Social Determinants of Health   Financial Resource Strain: Low Risk  (05/09/2020)   Overall Financial Resource Strain (CARDIA)    Difficulty of Paying Living Expenses: Not hard at all  Food Insecurity: No Food Insecurity (05/09/2020)   Hunger Vital Sign    Worried About Running Out of Food in the Last Year: Never true    Ran Out of Food in the Last Year: Never true  Transportation Needs: No Transportation Needs (05/09/2020)   PRAPARE - Administrator, Civil Service (Medical): No    Lack of Transportation (Non-Medical): No  Physical Activity: Insufficiently Active (05/09/2020)   Exercise Vital Sign    Days of Exercise per Week: 2 days    Minutes of Exercise per Session: 30 min  Stress: No Stress Concern Present  (05/09/2020)   [employer] of Occupational Health - Occupational Stress Questionnaire    Feeling of Stress : Only a little  Social Connections: Moderately Integrated (05/09/2020)   Social Connection and Isolation Panel [NHANES]    Frequency of Communication with Friends and Family: More than three times a week    Frequency of Social Gatherings with Friends and Family: More than three times a week    Attends Religious Services: 1 to 4 times per year    Active Member of [employer] or Organizations: No    Attends Banker Meetings: Never    Marital Status: Married  Catering manager Violence: Not At Risk (05/09/2020)   Humiliation, Afraid, Rape, and Kick questionnaire    Fear of Current or Ex-Partner: No    Emotionally Abused: No    Physically Abused: No    Sexually Abused: No   Family Status  Relation Name Status   Mother  Alive   Father  Alive   Sister  Alive   Brother  Alive   Mat Aunt  (Not Specified)   Emelda Brothers  (Not Specified)   Oneal Grout  (Not Specified)   PGM  (Not Specified)   PGF  (Not Specified)   Neg Hx  (Not Specified)  No partnership data on file   Family History  Problem Relation Age of Onset   Hypertension Mother    Hypercholesterolemia Father    Glaucoma Father    Deep vein thrombosis Father    Drug abuse Sister    Bipolar disorder Sister    Arthritis Brother    Colon polyps Maternal Aunt    Colon cancer Paternal Aunt    Heart disease Paternal Uncle    Diabetes Mellitus II Paternal Grandmother    Diabetes type II Paternal Grandfather    Heart disease Paternal Grandfather    Breast cancer Neg Hx    Lung cancer Neg Hx    Cervical cancer Neg Hx    Allergies  Allergen Reactions   Lisinopril Cough      Patient Care Team: Gilmore Laroche, FNP as PCP - General (Family Medicine)   Outpatient Medications Prior to Visit  Medication Sig   Cyanocobalamin (VITAMIN B 12 PO) Take 1 tablet by mouth daily.   Fe Bisgly-Vit C-Vit B12-FA (GENTLE IRON  PO) Take 1 tablet by mouth daily.   JOLESSA 0.15-0.03 MG tablet Take 1 tablet by mouth daily.   levocetirizine (XYZAL) 5 MG tablet TAKE 1 TABLET(5 MG) BY MOUTH EVERY EVENING   meclizine (  ANTIVERT) 12.5 MG tablet Take 1 tablet (12.5 mg total) by mouth 3 (three) times daily as needed for dizziness.   phentermine 15 MG capsule Take 1 capsule (15 mg total) by mouth every morning.   traZODone (DESYREL) 50 MG tablet TAKE 1/2 TO 1 TABLET(25 TO 50 MG) BY MOUTH AT BEDTIME AS NEEDED FOR SLEEP   No facility-administered medications prior to visit.    Review of Systems  Constitutional:  Negative for chills and fever.  HENT:  Negative for congestion, ear pain, hearing loss and nosebleeds.   Eyes:  Negative for double vision, discharge and redness.  Respiratory:  Negative for cough and shortness of breath.   Cardiovascular:  Negative for chest pain and claudication.  Gastrointestinal:  Negative for abdominal pain, diarrhea and vomiting.  Genitourinary:  Negative for dysuria, frequency and urgency.  Musculoskeletal:  Negative for back pain, joint pain and neck pain.  Neurological:  Negative for dizziness and headaches.  Endo/Heme/Allergies:  Positive for polydipsia.  Psychiatric/Behavioral:  The patient does not have insomnia.        Objective:    BP 130/85 (BP Location: Right Arm, Patient Position: Sitting, Cuff Size: Large)   Pulse (!) 109   Ht 5\' 5"  (1.651 m)   Wt 258 lb 1.9 oz (117.1 kg)   SpO2 97%   BMI 42.95 kg/m  BP Readings from Last 3 Encounters:  08/13/23 130/85  08/19/22 (!) 141/82  05/26/22 139/80   Wt Readings from Last 3 Encounters:  08/13/23 258 lb 1.9 oz (117.1 kg)  05/26/22 246 lb (111.6 kg)  02/02/22 244 lb (110.7 kg)      Physical Exam HENT:     Head: Normocephalic.     Left Ear: External ear normal.     Mouth/Throat:     Mouth: Mucous membranes are moist.  Eyes:     Extraocular Movements: Extraocular movements intact.     Pupils: Pupils are equal, round, and  reactive to light.  Cardiovascular:     Rate and Rhythm: Normal rate and regular rhythm.     Heart sounds: No murmur heard. Pulmonary:     Effort: Pulmonary effort is normal.     Breath sounds: Normal breath sounds.  Abdominal:     Palpations: Abdomen is soft.     Tenderness: There is no right CVA tenderness or left CVA tenderness.  Musculoskeletal:     Right lower leg: No edema.     Left lower leg: No edema.  Skin:    Findings: No lesion or rash.  Neurological:     Mental Status: She is alert and oriented to person, place, and time.     GCS: GCS eye subscore is 4. GCS verbal subscore is 5. GCS motor subscore is 6.     Cranial Nerves: No facial asymmetry.     Sensory: No sensory deficit.     Motor: No weakness.     Coordination: Coordination normal. Finger-Nose-Finger Test normal.     Gait: Gait normal.  Psychiatric:        Judgment: Judgment normal.     No results found for any visits on 08/13/23. Last CBC Lab Results  Component Value Date   WBC 6.2 08/21/2021   HGB 12.9 08/21/2021   HCT 39.9 08/21/2021   MCV 83 08/21/2021   MCH 26.9 08/21/2021   RDW 13.2 08/21/2021   PLT 355 08/21/2021   Last metabolic panel Lab Results  Component Value Date   GLUCOSE 76 12/26/2021   NA  139 12/26/2021   K 4.4 12/26/2021   CL 106 12/26/2021   CO2 23 12/26/2021   BUN 8 12/26/2021   CREATININE 0.77 12/26/2021   EGFR 97 12/26/2021   CALCIUM 8.5 (L) 12/26/2021   PROT 6.1 12/26/2021   ALBUMIN 3.4 (L) 12/26/2021   LABGLOB 2.7 12/26/2021   AGRATIO 1.3 12/26/2021   BILITOT 0.4 12/26/2021   ALKPHOS 48 12/26/2021   AST 14 12/26/2021   ALT 9 12/26/2021   Last lipids Lab Results  Component Value Date   CHOL 158 12/26/2021   HDL 43 12/26/2021   LDLCALC 103 (H) 12/26/2021   TRIG 59 12/26/2021   CHOLHDL 3.7 12/26/2021   Last hemoglobin A1c Lab Results  Component Value Date   HGBA1C 5.9 (H) 12/26/2021   Last thyroid functions Lab Results  Component Value Date   TSH 1.320  08/21/2021   Last vitamin D Lab Results  Component Value Date   VD25OH 136.0 (H) 12/26/2021   Last vitamin B12 and Folate Lab Results  Component Value Date   VITAMINB12 440 05/09/2020        Assessment & Plan:    Routine Health Maintenance and Physical Exam  Immunization History  Administered Date(s) Administered   PFIZER(Purple Top)SARS-COV-2 Vaccination 03/17/2020, 04/07/2020, 12/27/2020   Tdap 12/25/2021    Health Maintenance  Topic Date Due   Colonoscopy  Never done   INFLUENZA VACCINE  07/22/2023   PAP SMEAR-Modifier  04/01/2024   DTaP/Tdap/Td (2 - Td or Tdap) 12/26/2031   Hepatitis C Screening  Completed   HIV Screening  Completed   HPV VACCINES  Aged Out   COVID-19 Vaccine  Discontinued    Discussed health benefits of physical activity, and encouraged her to engage in regular exercise appropriate for her age and condition.  Annual physical exam Assessment & Plan: Physical exam as documented Discussed heart-healthy diet  Encouraged to Exercise: If you are able: 30 -60 minutes a day ,4 days a week, or 150 minutes a week. The longer the better. Combine stretch, strength, and aerobic activities Encourage to eat whole Food, Plant Predominant Nutrition is highly recommended: Eat Plenty of vegetables, Mushrooms, fruits, Legumes, Whole Grains, Nuts, seeds in lieu of processed meats, processed snacks/pastries red meat, poultry, eggs.  Will f/u in 1 year for CPE      IFG (impaired fasting glucose) -     Hemoglobin A1c  Vitamin D deficiency -     VITAMIN D 25 Hydroxy (Vit-D Deficiency, Fractures)  Other specified hypothyroidism -     TSH + free T4  Other hyperlipidemia -     Lipid panel -     CMP14+EGFR -     CBC with Differential/Platelet  Colon cancer screening -     Cologuard  Note: This chart has been completed using Engineer, civil (consulting) software, and while attempts have been made to ensure accuracy, certain words and phrases may not be  transcribed as intended.    Return in about 1 year (around 08/12/2024) for CPE.     Gilmore Laroche, FNP

## 2023-08-13 NOTE — Patient Instructions (Addendum)
I appreciate the opportunity to provide care to you today!    Follow up:  6 months  Fasting Labs: please stop by the lab during to get your blood drawn (CBC, CMP, TSH, Lipid profile, HgA1c, Vit D)   Attached with your AVS, you will find valuable resources for self-education. I highly recommend dedicating some time to thoroughly examine them.   Please continue to a heart-healthy diet and increase your physical activities. Try to exercise for at least five days a week.    It was a pleasure to see you and I look forward to continuing to work together on your health and well-being. Please do not hesitate to call the office if you need care or have questions about your care.  In case of emergency, please visit the Emergency Department for urgent care, or contact our clinic at [phone] to schedule an appointment. We're here to help you!   Have a wonderful day and week. With Gratitude, Gilmore Laroche MSN, FNP-BC

## 2023-08-14 NOTE — Assessment & Plan Note (Signed)

## 2023-08-20 DIAGNOSIS — R7301 Impaired fasting glucose: Secondary | ICD-10-CM | POA: Diagnosis not present

## 2023-08-20 DIAGNOSIS — E038 Other specified hypothyroidism: Secondary | ICD-10-CM | POA: Diagnosis not present

## 2023-08-20 DIAGNOSIS — E559 Vitamin D deficiency, unspecified: Secondary | ICD-10-CM | POA: Diagnosis not present

## 2023-08-20 DIAGNOSIS — E7849 Other hyperlipidemia: Secondary | ICD-10-CM | POA: Diagnosis not present

## 2023-08-21 LAB — HEMOGLOBIN A1C
Est. average glucose Bld gHb Est-mCnc: 128 mg/dL
Hgb A1c MFr Bld: 6.1 % — ABNORMAL HIGH (ref 4.8–5.6)

## 2023-08-21 LAB — CMP14+EGFR
ALT: 13 IU/L (ref 0–32)
AST: 14 IU/L (ref 0–40)
Albumin: 3.9 g/dL (ref 3.9–4.9)
Alkaline Phosphatase: 72 IU/L (ref 44–121)
BUN/Creatinine Ratio: 9 (ref 9–23)
BUN: 8 mg/dL (ref 6–24)
Bilirubin Total: 0.5 mg/dL (ref 0.0–1.2)
CO2: 19 mmol/L — ABNORMAL LOW (ref 20–29)
Calcium: 9.1 mg/dL (ref 8.7–10.2)
Chloride: 104 mmol/L (ref 96–106)
Creatinine, Ser: 0.88 mg/dL (ref 0.57–1.00)
Globulin, Total: 2.5 g/dL (ref 1.5–4.5)
Glucose: 82 mg/dL (ref 70–99)
Potassium: 3.8 mmol/L (ref 3.5–5.2)
Sodium: 139 mmol/L (ref 134–144)
Total Protein: 6.4 g/dL (ref 6.0–8.5)
eGFR: 82 mL/min/{1.73_m2} (ref >59)

## 2023-08-21 LAB — CBC WITH DIFFERENTIAL/PLATELET
Basophils Absolute: 0 10*3/uL (ref 0.0–0.2)
Basos: 1 %
EOS (ABSOLUTE): 0 10*3/uL (ref 0.0–0.4)
Eos: 0 %
Hematocrit: 41.5 % (ref 34.0–46.6)
Hemoglobin: 13.5 g/dL (ref 11.1–15.9)
Immature Grans (Abs): 0 10*3/uL (ref 0.0–0.1)
Immature Granulocytes: 0 %
Lymphocytes Absolute: 2 10*3/uL (ref 0.7–3.1)
Lymphs: 28 %
MCH: 27.1 pg (ref 26.6–33.0)
MCHC: 32.5 g/dL (ref 31.5–35.7)
MCV: 83 fL (ref 79–97)
Monocytes Absolute: 0.7 10*3/uL (ref 0.1–0.9)
Monocytes: 10 %
Neutrophils Absolute: 4.5 10*3/uL (ref 1.4–7.0)
Neutrophils: 61 %
Platelets: 363 10*3/uL (ref 150–450)
RBC: 4.98 x10E6/uL (ref 3.77–5.28)
RDW: 13.2 % (ref 11.7–15.4)
WBC: 7.2 10*3/uL (ref 3.4–10.8)

## 2023-08-21 LAB — LIPID PANEL
Chol/HDL Ratio: 3.4 ratio (ref 0.0–4.4)
Cholesterol, Total: 158 mg/dL (ref 100–199)
HDL: 46 mg/dL (ref >39)
LDL Chol Calc (NIH): 99 mg/dL (ref 0–99)
Triglycerides: 67 mg/dL (ref 0–149)
VLDL Cholesterol Cal: 13 mg/dL (ref 5–40)

## 2023-08-21 LAB — VITAMIN D 25 HYDROXY (VIT D DEFICIENCY, FRACTURES): Vit D, 25-Hydroxy: 53.1 ng/mL (ref 30.0–100.0)

## 2023-08-21 LAB — TSH+FREE T4
Free T4: 1.31 ng/dL (ref 0.82–1.77)
TSH: 0.918 u[IU]/mL (ref 0.450–4.500)

## 2023-08-23 NOTE — Progress Notes (Signed)
Please inform the patient that her hemoglobin A1c has increased from 5.9 a year ago to 6.1. She is prediabetic, and I recommend lifestyle changes, including reducing her intake of high-sugar foods and beverages and increasing physical activity. All other labs are stable.

## 2023-08-26 DIAGNOSIS — Z1211 Encounter for screening for malignant neoplasm of colon: Secondary | ICD-10-CM | POA: Diagnosis not present

## 2023-08-31 LAB — COLOGUARD: COLOGUARD: NEGATIVE

## 2023-09-03 NOTE — Progress Notes (Signed)
Please inform the patient that her Cologuard test was negative for colorectal cancer. It is recommended that she continue screening every 3 years.

## 2023-10-01 ENCOUNTER — Other Ambulatory Visit: Payer: Self-pay | Admitting: Family Medicine

## 2023-10-01 DIAGNOSIS — J3089 Other allergic rhinitis: Secondary | ICD-10-CM

## 2024-02-18 ENCOUNTER — Ambulatory Visit: Payer: BC Managed Care – PPO | Admitting: Family Medicine

## 2024-03-23 ENCOUNTER — Ambulatory Visit: Payer: Self-pay | Admitting: Family Medicine

## 2024-03-23 ENCOUNTER — Encounter: Payer: Self-pay | Admitting: Family Medicine

## 2024-03-23 VITALS — BP 117/85 | HR 80 | Ht 65.0 in | Wt 259.1 lb

## 2024-03-23 DIAGNOSIS — J310 Chronic rhinitis: Secondary | ICD-10-CM | POA: Diagnosis not present

## 2024-03-23 DIAGNOSIS — R42 Dizziness and giddiness: Secondary | ICD-10-CM

## 2024-03-23 DIAGNOSIS — O09529 Supervision of elderly multigravida, unspecified trimester: Secondary | ICD-10-CM | POA: Insufficient documentation

## 2024-03-23 DIAGNOSIS — R7303 Prediabetes: Secondary | ICD-10-CM | POA: Insufficient documentation

## 2024-03-23 DIAGNOSIS — Z98891 History of uterine scar from previous surgery: Secondary | ICD-10-CM | POA: Insufficient documentation

## 2024-03-23 DIAGNOSIS — Z0182 Encounter for allergy testing: Secondary | ICD-10-CM

## 2024-03-23 DIAGNOSIS — Z9884 Bariatric surgery status: Secondary | ICD-10-CM | POA: Insufficient documentation

## 2024-03-23 NOTE — Assessment & Plan Note (Signed)
 Last Hemoglobin A1c: 6.1 Labs: Ordered today, results pending; will follow up accordingly. Reviewed non-pharmacological interventions, including a balanced diet rich in lean proteins, healthy fats, whole grains, and high-fiber vegetables. Emphasized reducing refined sugars and processed carbohydrates, and incorporating more fruits, leafy greens, and legumes. Education: Patient was educated on recognizing signs and symptoms of both hypoglycemia and hyperglycemia, and advised to seek emergency care if these symptoms occur. Patient Understanding: The patient verbalized understanding of the care plan, and all questions were answered.

## 2024-03-23 NOTE — Progress Notes (Signed)
 Established Patient Office Visit   Subjective  Patient ID: Alisha Brock, female    DOB: [birth year]  Age: 48 y.o. MRN: [id number]  Chief Complaint  Patient presents with   Medical Management of Chronic Issues    6 month f/u     She  has a past medical history of Allergy, Anemia, Herniated lumbar intervertebral disc, Obesity, and Sleep apnea.  Dizziness The patient has a chronic history of dizziness, with the current episode starting more than 1 year ago and persisting constantly. The symptoms have been gradually worsening over time. Associated symptoms include congestion and vertigo, with no reports of headaches. The symptoms are aggravated by exertion, and the patient has tried position changes,  meclizine and lying down for relief, but these measures have provided no improvement.    Review of Systems  Constitutional:  Negative for chills and fever.  HENT:  Positive for congestion. Negative for tinnitus.   Eyes:  Negative for blurred vision.  Respiratory:  Negative for shortness of breath.   Cardiovascular:  Negative for chest pain and palpitations.  Gastrointestinal:  Negative for abdominal pain.  Neurological:  Positive for dizziness. Negative for headaches.      Objective:     BP 117/85   Pulse 80   Ht 5\' 5"  (1.651 m)   Wt 259 lb 1.9 oz (117.5 kg)   LMP 03/22/2024   SpO2 96%   BMI 43.12 kg/m  BP Readings from Last 3 Encounters:  03/23/24 117/85  08/13/23 130/85  08/19/22 (!) 141/82      Physical Exam Vitals reviewed.  Constitutional:      General: She is not in acute distress.    Appearance: Normal appearance. She is not ill-appearing, toxic-appearing or diaphoretic.  HENT:     Head: Normocephalic.     Right Ear: Tympanic membrane normal.     Left Ear: Tympanic membrane normal.     Nose: Congestion and rhinorrhea present.     Mouth/Throat:     Mouth: Mucous membranes are moist.  Eyes:     General:        Right eye: No discharge.         Left eye: No discharge.     Conjunctiva/sclera: Conjunctivae normal.  Cardiovascular:     Rate and Rhythm: Normal rate.     Pulses: Normal pulses.     Heart sounds: Normal heart sounds.  Pulmonary:     Effort: Pulmonary effort is normal. No respiratory distress.     Breath sounds: Normal breath sounds.  Abdominal:     General: Bowel sounds are normal.     Palpations: Abdomen is soft.     Tenderness: There is no abdominal tenderness. There is no right CVA tenderness, left CVA tenderness or guarding.  Musculoskeletal:        General: Normal range of motion.     Cervical back: Normal range of motion.  Skin:    General: Skin is warm and dry.     Capillary Refill: Capillary refill takes less than 2 seconds.  Neurological:     Mental Status: She is alert.     Coordination: Coordination normal.     Gait: Gait normal.  Psychiatric:        Mood and Affect: Mood normal.        Behavior: Behavior normal.      No results found for any visits on 03/23/24.  The 10-year ASCVD risk score (Arnett DK, et al., 2019) is: 0.9%  Assessment & Plan:  Prediabetes Assessment & Plan: Last Hemoglobin A1c: 6.1 Labs: Ordered today, results pending; will follow up accordingly. Reviewed non-pharmacological interventions, including a balanced diet rich in lean proteins, healthy fats, whole grains, and high-fiber vegetables. Emphasized reducing refined sugars and processed carbohydrates, and incorporating more fruits, leafy greens, and legumes. Education: Patient was educated on recognizing signs and symptoms of both hypoglycemia and hyperglycemia, and advised to seek emergency care if these symptoms occur. Patient Understanding: The patient verbalized understanding of the care plan, and all questions were answered.    Orders: -     Hemoglobin A1c  Encounter for allergy testing -     Ambulatory referral to Allergy  Chronic rhinitis -     Ambulatory referral to ENT -     Ambulatory referral to  Allergy  Vertigo Assessment & Plan: Failed Trial on meclizine Recent Labs WNL  BMP, CBC, Thyroid panel, Vit D Referral placed to ENT upon patient request Discussed , sleep with your head slightly elevated and rise slowly from bed in the morning to prevent sudden dizziness. Limiting salt, caffeine, and alcohol may also reduce fluid buildup in the inner ear, which can lessen vertigo episodes. Avoid positions or activities that trigger symptoms, such as bending over or quickly turning your head. Additionally, focus on slow, steady movements       Return in about 6 months (around 09/22/2024), or if symptoms worsen or fail to improve, for routine labs.   Cruzita Lederer Newman Nip, FNP

## 2024-03-23 NOTE — Patient Instructions (Addendum)
   Great to see you today.  I have refilled the medication(s) we provide.    Excess weight can impair the lymphatic system, which is responsible for draining excess fluid from tissues. When the lymphatic system is overwhelmed or damaged, fluid can accumulate in the legs.  Venous Insufficiency: Mechanism: Excess weight can put additional pressure on the veins in the legs, making it harder for blood to return to the heart. This pressure can cause the veins to become weak or damaged, leading to pooling of blood in the lower extremities. Symptoms: Swelling (edema), varicose veins, aching or heaviness in the legs.   If labs were collected, we will inform you of lab results once received either by echart message or telephone call.   - echart message- for normal results that have been seen by the patient already.   - telephone call: abnormal results or if patient has not viewed results in their echart.   - Please take medications as prescribed. - Follow up with your primary health provider if any health concerns arises. - If symptoms worsen please contact your primary care provider and/or visit the emergency department.

## 2024-03-23 NOTE — Assessment & Plan Note (Signed)
 Failed Trial on meclizine Recent Labs WNL  BMP, CBC, Thyroid panel, Vit D Referral placed to ENT upon patient request Discussed , sleep with your head slightly elevated and rise slowly from bed in the morning to prevent sudden dizziness. Limiting salt, caffeine, and alcohol may also reduce fluid buildup in the inner ear, which can lessen vertigo episodes. Avoid positions or activities that trigger symptoms, such as bending over or quickly turning your head. Additionally, focus on slow, steady movements

## 2024-03-24 ENCOUNTER — Encounter: Payer: Self-pay | Admitting: Family Medicine

## 2024-03-24 LAB — HEMOGLOBIN A1C
Est. average glucose Bld gHb Est-mCnc: 126 mg/dL
Hgb A1c MFr Bld: 6 % — ABNORMAL HIGH (ref 4.8–5.6)

## 2024-04-06 ENCOUNTER — Other Ambulatory Visit: Payer: Self-pay | Admitting: Family Medicine

## 2024-04-06 DIAGNOSIS — J3089 Other allergic rhinitis: Secondary | ICD-10-CM

## 2024-04-21 ENCOUNTER — Encounter: Payer: Self-pay | Admitting: Internal Medicine

## 2024-04-21 ENCOUNTER — Ambulatory Visit (INDEPENDENT_AMBULATORY_CARE_PROVIDER_SITE_OTHER): Admitting: Internal Medicine

## 2024-04-21 ENCOUNTER — Other Ambulatory Visit: Payer: Self-pay

## 2024-04-21 VITALS — BP 126/88 | HR 88 | Temp 98.7°F | Ht 63.0 in | Wt 259.6 lb

## 2024-04-21 DIAGNOSIS — J3089 Other allergic rhinitis: Secondary | ICD-10-CM

## 2024-04-21 MED ORDER — FLUTICASONE PROPIONATE 50 MCG/ACT NA SUSP: 2.0000 | Freq: Every day | NASAL | 5 refills | Status: AC

## 2024-04-21 MED ORDER — OLOPATADINE HCL 0.2 % OP SOLN: 1.0000 [drp] | Freq: Every day | OPHTHALMIC | 5 refills | Status: AC | PRN

## 2024-04-21 NOTE — Progress Notes (Addendum)
 NEW PATIENT  Date of Service/Encounter:  04/21/24  Consult requested by: Zarwolo, Gloria, FNP   Subjective:   Alisha Brock (DOB: 1976/04/26) is a 48 y.o. female who presents to the clinic on 04/21/2024 with a chief complaint of allergies.  .    History obtained from: chart review and patient.    Rhinitis:  Started about 10-15 years.    Symptoms include: runny nose, itchy ears, itchy throat, gritty eyes, sniffling, post nasal drainage, sneezing   Occurs year-round with seasonal flares in Spring Potential triggers: not sure  Treatments tried:  Xyzal  5mg  daily Ketotifen eye drops Afrin in the past Flonase in the past   Previous allergy testing: no History of sinus surgery: no Nonallergic triggers: none    Reviewed:  03/23/2024: seen by Lelon Putty NP, referred to ENT and Allergy for chronic rhinitis and vertigo.   08/13/2023: seen by Zarwolo NP for annual, no significant PMH.  Past Medical History: Past Medical History:  Diagnosis Date   Allergy    Anemia    Herniated lumbar intervertebral disc    Obesity    Sleep apnea     Past Surgical History: Past Surgical History:  Procedure Laterality Date   BARIATRIC SURGERY  11/2015   CESAREAN SECTION  August 2004   DILATION AND EVACUATION N/A 08/12/2018   Procedure: DILATATION AND EVACUATION;  Surgeon: Matt Song, MD;  Location: [facility];  Service: Gynecology;  Laterality: N/A;   DILATION AND EVACUATION N/A 02/10/2019   Procedure: DILATATION AND EVACUATION;  Surgeon: Matt Song, MD;  Location: [facility] [place];  Service: Gynecology;  Laterality: N/A;   HERNIA REPAIR Bilateral    Inguinal Hernia Repair at age 64.   TONSILLECTOMY     WISDOM TOOTH EXTRACTION  2010    Family History: Family History  Problem Relation Age of Onset   Hypertension Mother    Hypercholesterolemia Father    Glaucoma Father    Deep vein thrombosis Father    Drug abuse Sister    Bipolar disorder  Sister    Arthritis Brother    Colon polyps Maternal Aunt    Colon cancer Paternal Aunt    Heart disease Paternal Uncle    Diabetes Mellitus II Paternal Grandmother    Diabetes type II Paternal Grandfather    Heart disease Paternal Grandfather    Breast cancer Neg Hx    Lung cancer Neg Hx    Cervical cancer Neg Hx     Social History:  Flooring in bedroom: wood Pets: none Tobacco use/exposure: none 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 If you have any questions, ask your nurse or doctor.          cholecalciferol 25 MCG (1000 UNIT) tablet Commonly known as: VITAMIN D3 Take 1,000 Units by mouth daily.   fluticasone 50 MCG/ACT nasal spray Commonly known as: FLONASE Place 2 sprays into both nostrils daily. Started by: Kandice Orleans   GENTLE IRON PO Take 1 tablet by mouth daily.   Jolessa 0.15-0.03 MG tablet Generic drug: levonorgestrel-ethinyl estradiol Take 1 tablet by mouth daily.   ketoconazole 2 % cream Commonly known as: NIZORAL Apply 1 Application topically 2 (two) times daily.   levocetirizine 5 MG tablet Commonly  known as: XYZAL  TAKE 1 TABLET(5 MG) BY MOUTH EVERY EVENING   magnesium gluconate 500 (27 Mg) MG Tabs tablet Commonly known as: MAGONATE Take 120 mg by mouth in the morning and at bedtime.   Olopatadine HCl 0.2 % Soln Apply 1 drop to eye daily as needed (itchy watery eyes). Started by: Babak Lucus P Yigit Norkus   VITAMIN B 12 PO Take 1 tablet by mouth daily.         REVIEW OF SYSTEMS: Pertinent positives and negatives discussed in HPI.   Objective:   Physical Exam: BP 126/88 (BP Location: Left Arm, Patient Position: Sitting, Cuff Size: Large)   Pulse 88   Temp 98.7 F (37.1 C)   Ht 5\' 3"  (1.6 m)   Wt 259 lb 9.6 oz (117.8 kg)   LMP 03/22/2024   SpO2 99%   BMI 45.99 kg/m  Body mass index is  45.99 kg/m. GEN: alert, well developed HEENT: clear conjunctiva, nose with + moderate inferior turbinate hypertrophy, pink nasal mucosa, + clear rhinorrhea, + cobblestoning HEART: regular rate and rhythm, no murmur LUNGS: clear to auscultation bilaterally, no coughing, unlabored respiration ABDOMEN: soft, non distended  SKIN: no rashes or lesions  Assessment:   1. Other allergic rhinitis     Plan/Recommendations:  Other Allergic Rhinitis: - Due to turbinate hypertrophy, seasonal symptoms and unresponsive to over the counter meds, will perform skin testing to identify aeroallergen triggers.   - Use nasal saline rinses before nose sprays such as with Neilmed Sinus Rinse.  Use distilled water.   - Use Flonase 2 sprays each nostril daily. Aim upward and outward. - Use Xyzal  5mg  daily.  - For eyes, use Olopatadine or Ketotifen 1 eye drop daily as needed for itchy, watery eyes.  Available over the counter, if not covered by insurance.   Hold all anti-histamines (Xyzal , Allegra, Zyrtec, Claritin, Benadryl, Pepcid) 3 days prior to next visit.   Follow up: skin testing 1-55, IDs okay  Of note, chart had a flag for pregnancy; confirmed she is not pregnant.   Kristen Petri, MD [facility] 

## 2024-04-21 NOTE — Patient Instructions (Addendum)
 Other Allergic Rhinitis: - Use nasal saline rinses before nose sprays such as with Neilmed Sinus Rinse.  Use distilled water.   - Use Flonase 2 sprays each nostril daily. Aim upward and outward. - Use Xyzal  5mg  daily.  - For eyes, use Olopatadine or Ketotifen 1 eye drop daily as needed for itchy, watery eyes.  Available over the counter, if not covered by insurance.   Hold all anti-histamines (Xyzal , Allegra, Zyrtec, Claritin, Benadryl, Pepcid) 3 days prior to next visit.   Follow up: skin testing 1-55

## 2024-05-04 ENCOUNTER — Ambulatory Visit: Admitting: Allergy

## 2024-05-04 ENCOUNTER — Ambulatory Visit (INDEPENDENT_AMBULATORY_CARE_PROVIDER_SITE_OTHER): Admitting: Allergy

## 2024-05-04 ENCOUNTER — Encounter: Payer: Self-pay | Admitting: Allergy

## 2024-05-04 DIAGNOSIS — J3089 Other allergic rhinitis: Secondary | ICD-10-CM

## 2024-05-04 NOTE — Progress Notes (Signed)
 Follow-up Note  RE: Alisha Brock MRN: [id number] DOB: 11-25-1976 Date of Office Visit: 05/04/2024   History of present illness: Alisha Brock is a 48 y.o. female presenting today for skin testing visit.  She was last seen in the office on 04/12/24 for rhinoconjunctivitis.  She is in her usual state of health today without recent illness.  She has held antihistamines for at least 3 days for testing today.   Medication List: Current Outpatient Medications  Medication Sig Dispense Refill   cholecalciferol (VITAMIN D3) 25 MCG (1000 UNIT) tablet Take 1,000 Units by mouth daily.     Cyanocobalamin  (VITAMIN B 12 PO) Take 1 tablet by mouth daily.     Fe Bisgly-Vit C-Vit B12-FA (GENTLE IRON PO) Take 1 tablet by mouth daily.     fluticasone  (FLONASE ) 50 MCG/ACT nasal spray Place 2 sprays into both nostrils daily. 16 g 5   JOLESSA 0.15-0.03 MG tablet Take 1 tablet by mouth daily.     ketoconazole (NIZORAL) 2 % cream Apply 1 Application topically 2 (two) times daily.     levocetirizine (XYZAL ) 5 MG tablet TAKE 1 TABLET(5 MG) BY MOUTH EVERY EVENING 90 tablet 1   magnesium gluconate (MAGONATE) 500 (27 Mg) MG TABS tablet Take 120 mg by mouth in the morning and at bedtime.     Olopatadine  HCl 0.2 % SOLN Apply 1 drop to eye daily as needed (itchy watery eyes). 2.5 mL 5   No current facility-administered medications for this visit.     Known medication allergies: Allergies  Allergen Reactions   Lisinopril  Cough    Diagnostics/Labs:  Allergy testing:   Airborne Adult Perc - 05/04/24 0936     Time Antigen Placed 4098    Allergen Manufacturer Floyd Hutchinson    Location Back    Number of Test 55    Panel 1 Select    1. Control-Buffer 50% Glycerol Negative    2. Control-Histamine 2+    3. Bahia 2+    4. French Southern Territ[place] Negative    5. Johnson Negative    6. [place]  Blue 2+    7. Meadow Fescue 2+    8. Perennial Rye 2+    9. Timothy Negative    10. Ragweed Mix Negative    11.  Cocklebur Negative    12. Plantain,  English Negative    13. Baccharis Negative    14. Dog Fennel Negative    15. Guernsey Thistle 2+    16. Lamb's Quarters Negative    17. Sheep Sorrell Negative    18. Rough Pigweed 2+    19. Marsh Elder, Rough Negative    20. Mugwort, Common Negative    21. Box, Elder 2+    22. Cedar, red Negative    23. Sweet Gum Negative    24. Pecan Pollen Negative    25. Pine Mix 2+    26. Walnut, Black Pollen Negative    27. Red Mulberry Negative    28. Ash Mix Negative    29. Birch Mix 2+    30. Beech American Negative    31. Cottonwood, Guinea-Bissau Negative    32. Hickory, White Negative    33. Maple Mix Negative    34. Oak, Guinea-[place]issau Mix Negative    35. Sycamore Eastern Negative    36. Alternaria Alternata 2+    37. Cladosporium Herbarum Negative    38. Aspergillus Mix 2+    39. Penicillium Mix 2+    40. Bipolaris Sorokiniana (Helminthosporium)  2+    41. Drechslera Spicifera (Curvularia) Negative    42. Mucor Plumbeus 2+    43. Fusarium Moniliforme Negative    44. Aureobasidium Pullulans (pullulara) 2+    45. Rhizopus Oryzae 2+    46. Botrytis Cinera 2+    47. Epicoccum Nigrum Negative    48. Phoma Betae 2+    49. Dust Mite Mix 2+    50. Cat Hair 10,000 BAU/ml Negative    51.  Dog Epithelia 2+    52. Mixed Feathers 2+    53. Horse Epithelia 2+    54. Cockroach, German 2+    55. Tobacco Leaf 2+             Allergy testing results were read and interpreted by provider, documented by clinical staff.   Assessment and plan:   Other Allergic Rhinitis: - Use nasal saline rinses before nose sprays such as with Neilmed Sinus Rinse.  Use distilled water.   - Use Flonase  2 sprays each nostril daily. Aim upward and outward. - Use Xyzal  5mg  daily.  You can use an extra dose of the antihistamine, if needed, for breakthrough symptoms.  - For eyes, use Olopatadine  or Ketotifen 1 eye drop daily as needed for itchy, watery eyes.  Available over the  counter, if not covered by insurance.   - Testing today showed: grasses, weeds, trees, indoor molds, outdoor molds, dust mites, dog, cockroach, horse, and mixed feathers - Copy of test results provided.  - Avoidance measures provided. - Consider allergy shots as a means of long-term control. - Allergy shots "re-train" and "reset" the immune system to ignore environmental allergens and decrease the resulting immune response to those allergens (sneezing, itchy watery eyes, runny nose, nasal congestion, etc).    - Allergy shots improve symptoms in 75-85% of patients.  - We can discuss more at a future appointment if the medications are not working for you.   Follow up: 6 months or sooner if needed  I appreciate the opportunity to take part in [facility] care. Please do not hesitate to contact me with questions.  Sincerely,   Catha Clink, MD Allergy/Immunology [facility]

## 2024-05-04 NOTE — Patient Instructions (Signed)
 Other Allergic Rhinitis: - Use nasal saline rinses before nose sprays such as with Neilmed Sinus Rinse.  Use distilled water.   - Use Flonase  2 sprays each nostril daily. Aim upward and outward. - Use Xyzal  5mg  daily.  You can use an extra dose of the antihistamine, if needed, for breakthrough symptoms.  - For eyes, use Olopatadine  or Ketotifen 1 eye drop daily as needed for itchy, watery eyes.  Available over the counter, if not covered by insurance.   - Testing today showed: grasses, weeds, trees, indoor molds, outdoor molds, dust mites, dog, cockroach, horse, and mixed feathers - Copy of test results provided.  - Avoidance measures provided. - Consider allergy shots as a means of long-term control. - Allergy shots "re-train" and "reset" the immune system to ignore environmental allergens and decrease the resulting immune response to those allergens (sneezing, itchy watery eyes, runny nose, nasal congestion, etc).    - Allergy shots improve symptoms in 75-85% of patients.  - We can discuss more at a future appointment if the medications are not working for you.   Follow up: 6 months or sooner if needed

## 2024-06-22 ENCOUNTER — Institutional Professional Consult (permissible substitution) (INDEPENDENT_AMBULATORY_CARE_PROVIDER_SITE_OTHER): Admitting: Otolaryngology

## 2024-06-22 ENCOUNTER — Ambulatory Visit (INDEPENDENT_AMBULATORY_CARE_PROVIDER_SITE_OTHER): Admitting: Audiology

## 2024-07-20 DIAGNOSIS — Z01419 Encounter for gynecological examination (general) (routine) without abnormal findings: Secondary | ICD-10-CM | POA: Diagnosis not present

## 2024-07-20 DIAGNOSIS — Z1231 Encounter for screening mammogram for malignant neoplasm of breast: Secondary | ICD-10-CM | POA: Diagnosis not present

## 2024-09-21 ENCOUNTER — Ambulatory Visit (INDEPENDENT_AMBULATORY_CARE_PROVIDER_SITE_OTHER): Admitting: Audiology

## 2024-09-21 ENCOUNTER — Encounter (INDEPENDENT_AMBULATORY_CARE_PROVIDER_SITE_OTHER): Payer: Self-pay | Admitting: Otolaryngology

## 2024-09-21 ENCOUNTER — Ambulatory Visit (INDEPENDENT_AMBULATORY_CARE_PROVIDER_SITE_OTHER): Admitting: Otolaryngology

## 2024-09-21 VITALS — BP 150/75 | HR 90 | Ht 63.0 in | Wt 258.0 lb

## 2024-09-21 DIAGNOSIS — J3489 Other specified disorders of nose and nasal sinuses: Secondary | ICD-10-CM | POA: Diagnosis not present

## 2024-09-21 DIAGNOSIS — R42 Dizziness and giddiness: Secondary | ICD-10-CM | POA: Diagnosis not present

## 2024-09-21 DIAGNOSIS — H608X3 Other otitis externa, bilateral: Secondary | ICD-10-CM

## 2024-09-21 DIAGNOSIS — R0981 Nasal congestion: Secondary | ICD-10-CM | POA: Diagnosis not present

## 2024-09-21 DIAGNOSIS — J300 Vasomotor rhinitis: Secondary | ICD-10-CM

## 2024-09-21 DIAGNOSIS — Z0110 Encounter for examination of ears and hearing without abnormal findings: Secondary | ICD-10-CM

## 2024-09-21 MED ORDER — FLUOCINOLONE ACETONIDE 0.01 % OT OIL: 4.0000 [drp] | TOPICAL_OIL | Freq: Two times a day (BID) | OTIC | 1 refills | Status: AC

## 2024-09-21 MED ORDER — IPRATROPIUM BROMIDE 0.06 % NA SOLN: 2.0000 | Freq: Four times a day (QID) | NASAL | 12 refills | Status: AC | PRN

## 2024-09-21 MED ORDER — BETAMETHASONE DIPROPIONATE 0.05 % EX CREA: TOPICAL_CREAM | Freq: Two times a day (BID) | CUTANEOUS | 0 refills | Status: AC

## 2024-09-21 NOTE — Progress Notes (Signed)
  [address] [place], [place] [address] [phone]  Audiological Evaluation    Name: Alisha Brock     DOB:   1976/03/22      MRN:   [id number]                                                                                     Service Date: 09/21/2024     Accompanied by: husband   Patient comes today after Dr. Tobie, ENT sent a referral for a hearing evaluation due to concerns with dizziness.   Symptoms Yes Details  Hearing loss  []    Tinnitus  [x]  Rarely- switches ears  Ear pain/ infections/pressure  []    Balance problems  [x]   Reports  that 2-3 times a year feels a spinning sensation. Last one was in January and lasted 1 1/2 week  Noise exposure history  [x]  Music, car- some shooting with hearing protection  Previous ear surgeries  []    Family history of hearing loss  []    Amplification  []    Other  []      Otoscopy: Right ear: Clear external ear canal and notable landmarks visualized on the tympanic membrane. Left ear:  Clear external ear canal and notable landmarks visualized on the tympanic membrane.  Tympanometry: Right ear: Type A- Normal external ear canal volume with normal middle ear pressure and tympanic membrane compliance. Left ear: Type A- Normal external ear canal volume with normal middle ear pressure and tympanic membrane compliance.   Pure tone Audiometry:  Normal hearing from 125 Hz - 8000 Hz, in both ears.  Speech Audiometry: Right ear- Speech Reception Threshold (SRT) was obtained at 0 dBHL. Left ear-Speech Reception Threshold (SRT) was obtained at 0 dBHL.   Word Recognition Score Tested using NU-6 (recorded) Right ear: 100% was obtained at a presentation level of 45 dBHL with contralateral masking which is deemed as  excellent. Left ear: 100% was obtained at a presentation level of 45 dBHL with contralateral masking which is deemed as  excellent.   The hearing test results were completed under headphones and results are  deemed to be of good reliability. Test technique:  conventional    Impression: There is not a significant difference in pure-tone thresholds between ears. There is not a significant difference in the word recognition score in between ears.    Recommendations: Follow up with ENT as scheduled for today. Return for a hearing evaluation if concerns with hearing changes arise or per MD recommendation.   Roran Wegner MARIE LEROUX-MARTINEZ, AUD

## 2024-09-21 NOTE — Progress Notes (Signed)
 Dear Dr. Terry Wilhelmena Brock, Here is my assessment for our mutual patient, Alisha Brock. Thank you for allowing me the opportunity to care for your patient. Please do not hesitate to contact me should you have any other questions. Sincerely, Dr. Eldora Brock  Otolaryngology Clinic Note Referring provider: Dr. Terry Wilhelmena Brock HPI:  Alisha Brock is a 48 y.o. female kindly referred by Dr. Terry Wilhelmena Brock for evaluation of dizziness and rhinitis  Initial visit (08/2024): Patient reports: she has episodic vertigo which occurs about 2-3 times per year. Last time was around January and lasted around a week and a half. She reports that if she turned over in the bed or laid her head down or if she stands up too fast, she will have frank vertigo. This lasts about 30 seconds, and then when she moves her head, and it will start again. These episodes typically resolve by themselves, but then in January the episodes lasted the same time but lasted longer. Was prescribed meclizine  but did not take it. Went away by itself. No nausea. Denies any ear fullness, tinnitus, no ear pressure. Hearing did not decline. No headaches, no light or sound sensitivity. Ears do itch constantly  Patient denies: ear pain, fullness, vertigo, purulent drainage, tinnitus Patient additionally denies: deep pain in ear canal, eustachian tube symptoms such as popping, crackling, sensitive to pressure changes Patient also denies barotrauma, vestibular suppressant use, ototoxic medication use Prior ear surgery: no Don't even think I remember getting an ear infection  From nasal standpoint, she was seen by allergy  with multiple positives. Main complaint is significant nasal dripping -- with eating, showering. Bilateral. Not discolored. No perfume/temp/pressure exacerbation. She does have typical AR symptoms. Usually does have alternating congestion, during day does fairly well. Haven't had a sinus infection since before COVID.  No facial pain/pressure, discolored drainage. She uses flonase  once or twice per week (gave me a headache and prescription xyzal )  ENT Surgery: T&A (high school) Personal or FHx of bleeding dz or anesthesia difficulty: no  AP/AC: no  Tobacco: no  PMHx: PreDM  Independent Review of Additional Tests or Records:  Alisha Brock (03/23/2024): dizziness - constant; worsening; congestion and vertigo(?); tried position changes, meclizine  but without relief. Dx: Rhinitis, Vertigo; Rx: ref to ENT Alisha Brock (04/21/2024): rhinitis for about 10-15 years, runny nose, typical AR symptoms; year round; tried xyzal , afrin, flonase . Dx: Other AR; Rx: skin test, flonase , xyzal  Allergy  testing 05/04/2024 reviewed: + grasses, weeds, trees, molds, dust mites, dog, horse, feathers CBC, CMP, TSH 08/20/2023: WBC 7.2, Eos 0; BUN/Cr 8/0.88; TSH 0.918, FT4 1.31 (wnl) 08/2024 Audiogram was independently reviewed and interpreted by me and it reveals - A/A tymps; hearing thresholds wnl  SNHL= Sensorineural hearing loss   PMH/Meds/All/SocHx/FamHx/ROS:   Past Medical History:  Diagnosis Date   Allergy     Anemia    Herniated lumbar intervertebral disc    Obesity    Sleep apnea      Past Surgical History:  Procedure Laterality Date   BARIATRIC SURGERY  11/2015   CESAREAN SECTION  August 2004   DILATION AND EVACUATION N/A 08/12/2018   Procedure: DILATATION AND EVACUATION;  Surgeon: Alisha Browning, MD;  Location: [facility];  Service: Gynecology;  Laterality: N/A;   DILATION AND EVACUATION N/A 02/10/2019   Procedure: DILATATION AND EVACUATION;  Surgeon: Alisha Browning, MD;  Location: [facility] [place];  Service: Gynecology;  Laterality: N/A;   HERNIA REPAIR Bilateral    Inguinal Hernia Repair at  age 54.   TONSILLECTOMY     WISDOM TOOTH EXTRACTION  2010    Family History  Problem Relation Age of Onset   Hypertension Mother    Hypercholesterolemia Father    Glaucoma Father    Deep vein  thrombosis Father    Drug abuse Sister    Bipolar disorder Sister    Arthritis Brother    Colon polyps Maternal Aunt    Colon cancer Paternal Aunt    Heart disease Paternal Uncle    Diabetes Mellitus II Paternal Grandmother    Diabetes type II Paternal Grandfather    Heart disease Paternal Grandfather    Breast cancer Neg Hx    Lung cancer Neg Hx    Cervical cancer Neg Hx      Social Connections: Unknown (03/22/2024)   Social Connection and Isolation Panel    Frequency of Communication with Friends and Family: Twice a week    Frequency of Social Gatherings with Friends and Family: Once a week    Attends Religious Services: Patient declined    Database administrator or Organizations: No    Attends Engineer, structural: Not on file    Marital Status: Married      Current Outpatient Medications:    betamethasone dipropionate 0.05 % cream, Apply topically 2 (two) times daily for 14 days. Just to outside of ear canal, Disp: 30 g, Rfl: 0   buPROPion ER (WELLBUTRIN SR) 100 MG 12 hr tablet, Take by mouth., Disp: , Rfl:    cholecalciferol (VITAMIN D3) 25 MCG (1000 UNIT) tablet, Take 1,000 Units by mouth daily., Disp: , Rfl:    Cyanocobalamin  (VITAMIN B 12 PO), Take 1 tablet by mouth daily., Disp: , Rfl:    Fe Bisgly-Vit C-Vit B12-FA (GENTLE IRON PO), Take 1 tablet by mouth daily., Disp: , Rfl:    Fluocinolone Acetonide (DERMOTIC) 0.01 % OIL, Place 4 drops in ear(s) in the morning and at bedtime for 14 days., Disp: 20 mL, Rfl: 1   fluticasone  (FLONASE ) 50 MCG/ACT nasal spray, Place 2 sprays into both nostrils daily., Disp: 16 g, Rfl: 5   ipratropium (ATROVENT) 0.06 % nasal spray, Place 2 sprays into both nostrils 4 (four) times daily as needed., Disp: 15 mL, Rfl: 12   JOLESSA 0.15-0.03 MG tablet, Take 1 tablet by mouth daily., Disp: , Rfl:    ketoconazole (NIZORAL) 2 % cream, Apply 1 Application topically 2 (two) times daily., Disp: , Rfl:    levocetirizine (XYZAL ) 5 MG tablet, TAKE  1 TABLET(5 MG) BY MOUTH EVERY EVENING, Disp: 90 tablet, Rfl: 1   magnesium gluconate (MAGONATE) 500 (27 Mg) MG TABS tablet, Take 120 mg by mouth in the morning and at bedtime., Disp: , Rfl:    naltrexone (DEPADE) 50 MG tablet, Take 50 mg by mouth daily., Disp: , Rfl:    Olopatadine  HCl 0.2 % SOLN, Apply 1 drop to eye daily as needed (itchy watery eyes)., Disp: 2.5 mL, Rfl: 5   Physical Exam:   BP (!) 150/75 (BP Location: Left Wrist, Patient Position: Sitting, Cuff Size: Large)   Pulse 90   Ht 5' 3 (1.6 m)   Wt 258 lb (117 kg)   SpO2 97%   BMI 45.70 kg/m   Salient findings:  CN II-XII intact Bilateral EAC clear and TM intact with well pneumatized middle ear spaces No gross nystagmus, gait normal, DH neg Anterior rhinoscopy: Septum relatively midline; bilateral inferior turbinates with modest hypertrophy; Nasal endoscopy was indicated to  better evaluate the nose and paranasal sinuses, given the patient's history and exam findings, and is detailed below. No lesions of oral cavity/oropharynx; No obviously palpable neck masses/lymphadenopathy/thyromegaly No respiratory distress or stridor  Seprately Identifiable Procedures:  Prior to initiating any procedures, risks/benefits/alternatives were explained to the patient and verbal consent obtained. PROCEDURE: Bilateral Diagnostic Rigid Nasal Endoscopy Pre-procedure diagnosis: Rhinorrhea, nasal congestion Post-procedure diagnosis: same Indication: See pre-procedure diagnosis and physical exam above Complications: None apparent EBL: 0 mL Anesthesia: Lidocaine  4% and topical decongestant was topically sprayed in each nasal cavity  Description of Procedure:  Patient was identified. A rigid 30 degree endoscope was utilized to evaluate the sinonasal cavities, mucosa, sinus ostia and turbinates and septum.  Overall, signs of mucosal inflammation are not noted.  Also noted are fair amount of mucoid secretions in nose.  No mucopurulence, polyps, or  masses noted.   Right Middle meatus: clear Right SE Recess: clear Left MM: clear Left SE Recess: clear  Photodocumentation was obtained.  CPT CODE -- 68768 - Mod 25   Impression & Plans:  Alisha Brock is a 48 y.o. female with:  1. Vertigo   2. Chronic eczematous otitis externa of both ears   3. Nasal congestion   4. Rhinorrhea   5. Vasomotor rhinitis    Multiple complaints - Ears: Vertigo appears to be BPPV based on hx; audio reassuring; discussed options and she opted for observation and trial epley if recurs; will eczematoid OE as below - Nasal: Likely component of allergic and non-allergic rhinitis; primary complaint is dripping so will do atrovent QID PRN  D/w pt f/u, opted PRN  See below regarding exact medications prescribed this encounter including dosages and route: Meds ordered this encounter  Medications   betamethasone dipropionate 0.05 % cream    Sig: Apply topically 2 (two) times daily for 14 days. Just to outside of ear canal    Dispense:  30 g    Refill:  0   Fluocinolone Acetonide (DERMOTIC) 0.01 % OIL    Sig: Place 4 drops in ear(s) in the morning and at bedtime for 14 days.    Dispense:  20 mL    Refill:  1   ipratropium (ATROVENT) 0.06 % nasal spray    Sig: Place 2 sprays into both nostrils 4 (four) times daily as needed.    Dispense:  15 mL    Refill:  12      Thank you for allowing me the opportunity to care for your patient. Please do not hesitate to contact me should you have any other questions.  Sincerely, Alisha Blanch, MD Otolaryngologist (ENT), [facility] Phone: [phone] Fax: [phone]  09/21/2024, 9:28 AM   MDM:  Level 4 - [phone] Complexity/Problems addressed: mod - chronic problems, multiple Data complexity: mod - independent review of notes, labs, order/review test - Morbidity: mod  - Prescription Drug prescribed or managed: y

## 2024-09-21 NOTE — Patient Instructions (Signed)
 Use betamethasone ointment and dermotic oil in 2 weeks Use atrovent nasal spray two sprays each nostril up to 4 times per day for dripping.

## 2024-09-22 ENCOUNTER — Ambulatory Visit: Payer: Self-pay | Admitting: Family Medicine

## 2024-09-28 ENCOUNTER — Ambulatory Visit (INDEPENDENT_AMBULATORY_CARE_PROVIDER_SITE_OTHER): Admitting: Audiology

## 2024-09-28 ENCOUNTER — Institutional Professional Consult (permissible substitution) (INDEPENDENT_AMBULATORY_CARE_PROVIDER_SITE_OTHER): Admitting: Otolaryngology

## 2024-10-03 ENCOUNTER — Encounter: Payer: Self-pay | Admitting: Emergency Medicine

## 2024-10-03 ENCOUNTER — Ambulatory Visit
Admission: EM | Admit: 2024-10-03 | Discharge: 2024-10-03 | Disposition: A | Discharge status: Home / Self Care | Attending: Family Medicine | Admitting: Family Medicine

## 2024-10-03 ENCOUNTER — Other Ambulatory Visit: Payer: Self-pay

## 2024-10-03 DIAGNOSIS — R002 Palpitations: Secondary | ICD-10-CM

## 2024-10-03 NOTE — ED Triage Notes (Addendum)
 Pt reports started naltrexone today, 1st dose was approx noon today, and reports HR has remained over 100 and I can't get it down. Pt noted to be tearful. Pt denies chest pain, shortness of breath.

## 2024-10-03 NOTE — Discharge Instructions (Signed)
 Discontinue the naltrexone in case this is what is causing your elevated heart rate.  Rest, stay well-hydrated and call your primary care provider first thing tomorrow morning.  We have sent out some labs and we will let you know if anything comes back abnormal when we get these back.  Go to the emergency department if experiencing any new or worsening symptoms

## 2024-10-05 LAB — CBC WITH DIFFERENTIAL/PLATELET
Basophils Absolute: 0.1 x10E3/uL (ref 0.0–0.2)
Basos: 1 %
EOS (ABSOLUTE): 0 x10E3/uL (ref 0.0–0.4)
Eos: 0 %
Hematocrit: 49.4 % — ABNORMAL HIGH (ref 34.0–46.6)
Hemoglobin: 15.2 g/dL (ref 11.1–15.9)
Immature Grans (Abs): 0 x10E3/uL (ref 0.0–0.1)
Immature Granulocytes: 0 %
Lymphocytes Absolute: 1.7 x10E3/uL (ref 0.7–3.1)
Lymphs: 24 %
MCH: 27.1 pg (ref 26.6–33.0)
MCHC: 30.8 g/dL — ABNORMAL LOW (ref 31.5–35.7)
MCV: 88 fL (ref 79–97)
Monocytes Absolute: 0.5 x10E3/uL (ref 0.1–0.9)
Monocytes: 7 %
Neutrophils Absolute: 4.8 x10E3/uL (ref 1.4–7.0)
Neutrophils: 68 %
Platelets: 379 x10E3/uL (ref 150–450)
RBC: 5.6 x10E6/uL — ABNORMAL HIGH (ref 3.77–5.28)
RDW: 13.2 % (ref 11.7–15.4)
WBC: 7 x10E3/uL (ref 3.4–10.8)

## 2024-10-05 LAB — COMPREHENSIVE METABOLIC PANEL WITH GFR
ALT: 15 IU/L (ref 0–32)
AST: 15 IU/L (ref 0–40)
Albumin: 4.2 g/dL (ref 3.9–4.9)
Alkaline Phosphatase: 81 IU/L (ref 41–116)
BUN/Creatinine Ratio: 8 — ABNORMAL LOW (ref 9–23)
BUN: 7 mg/dL (ref 6–24)
Bilirubin Total: 0.3 mg/dL (ref 0.0–1.2)
CO2: 17 mmol/L — ABNORMAL LOW (ref 20–29)
Calcium: 9.6 mg/dL (ref 8.7–10.2)
Chloride: 103 mmol/L (ref 96–106)
Creatinine, Ser: 0.89 mg/dL (ref 0.57–1.00)
Globulin, Total: 2.8 g/dL (ref 1.5–4.5)
Glucose: 107 mg/dL — ABNORMAL HIGH (ref 70–99)
Potassium: 4.1 mmol/L (ref 3.5–5.2)
Sodium: 139 mmol/L (ref 134–144)
Total Protein: 7 g/dL (ref 6.0–8.5)
eGFR: 80 mL/min/1.73 (ref >59)

## 2024-10-05 LAB — TSH: TSH: 0.876 u[IU]/mL (ref 0.450–4.500)

## 2024-10-06 ENCOUNTER — Ambulatory Visit (HOSPITAL_COMMUNITY): Payer: Self-pay

## 2024-10-06 NOTE — ED Provider Notes (Signed)
 [facility]    CSN: [id number] Arrival date & time: 10/03/24  1951      History   Chief Complaint Chief Complaint  Patient presents with   Palpitations    HPI Alisha Brock is a 48 y.o. female.   Patient presenting today with several hours of palpitations, elevated heart rate that started while taking a walk earlier.  She denies any associated chest pain, shortness of breath, dizziness, nausea, vomiting, diaphoresis.  Only recent changes she just started naltrexone this afternoon for the first time for weight loss.  Past medical history significant for hypertension, anxiety, prediabetes.  Not currently on any antihypertensives.    Past Medical History:  Diagnosis Date   Allergy     Anemia    Herniated lumbar intervertebral disc    Obesity    Sleep apnea     Patient Active Problem List   Diagnosis Date Noted   Maternal age 313+, multigravida, antepartum 03/23/2024   History of bariatric surgery 03/23/2024   History of cesarean section 03/23/2024   Prediabetes 03/23/2024   Vertigo 05/26/2022   Refused influenza vaccine 02/02/2022   Annual physical exam 12/25/2021   Anxiety 12/25/2021   Need for diphtheria-tetanus-pertussis (Tdap) vaccine 12/25/2021   Insomnia 07/22/2021   Morbid obesity (HCC) 08/08/2020   Environmental and seasonal allergies 05/09/2020   Essential hypertension 05/09/2020   H/O bariatric surgery 05/09/2020   Other fatigue 05/09/2020   Depression 05/09/2020   Pregnancy 07/28/2018    Past Surgical History:  Procedure Laterality Date   BARIATRIC SURGERY  11/2015   CESAREAN SECTION  August 2004   DILATION AND EVACUATION N/A 08/12/2018   Procedure: DILATATION AND EVACUATION;  Surgeon: Sarrah Browning, MD;  Location: [facility];  Service: Gynecology;  Laterality: N/A;   DILATION AND EVACUATION N/A 02/10/2019   Procedure: DILATATION AND EVACUATION;  Surgeon: Sarrah Browning, MD;  Location: [facility];  Service:  Gynecology;  Laterality: N/A;   HERNIA REPAIR Bilateral    Inguinal Hernia Repair at age 32.   TONSILLECTOMY     WISDOM TOOTH EXTRACTION  2010    OB History   No obstetric history on file.      Home Medications    Prior to Admission medications   Medication Sig Start Date End Date Taking? Authorizing Provider  buPROPion ER (WELLBUTRIN SR) 100 MG 12 hr tablet Take by mouth. 09/17/24  Yes [provider]  cholecalciferol (VITAMIN D3) 25 MCG (1000 UNIT) tablet Take 1,000 Units by mouth daily.    [provider]  Cyanocobalamin  (VITAMIN B 12 PO) Take 1 tablet by mouth daily.    [provider]  Fe Bisgly-Vit C-Vit B12-FA (GENTLE IRON PO) Take 1 tablet by mouth daily.    [provider]  fluticasone  (FLONASE ) 50 MCG/ACT nasal spray Place 2 sprays into both nostrils daily. 04/21/24   Tobie Arleta SQUIBB, MD  ipratropium (ATROVENT) 0.06 % nasal spray Place 2 sprays into both nostrils 4 (four) times daily as needed. 09/21/24   Patel, Kunjan B, MD  JOLESSA 0.15-0.03 MG tablet Take 1 tablet by mouth daily. 02/27/20   [provider]  ketoconazole (NIZORAL) 2 % cream Apply 1 Application topically 2 (two) times daily.    [provider]  levocetirizine (XYZAL ) 5 MG tablet TAKE 1 TABLET(5 MG) BY MOUTH EVERY EVENING 04/06/24   Bacchus, Gloria Z, FNP  magnesium gluconate (MAGONATE) 500 (27 Mg) MG TABS tablet Take 120 mg by mouth in the morning and at  bedtime.    [provider]  naltrexone (DEPADE) 50 MG tablet Take 50 mg by mouth daily. 09/15/24   [provider]  Olopatadine  HCl 0.2 % SOLN Apply 1 drop to eye daily as needed (itchy watery eyes). 04/21/24   Tobie Arleta SQUIBB, MD    Family History Family History  Problem Relation Age of Onset   Hypertension Mother    Hypercholesterolemia Father    Glaucoma Father    Deep vein thrombosis Father    Drug abuse Sister    Bipolar disorder Sister    Arthritis Brother    Colon polyps Maternal  Aunt    Colon cancer Paternal Aunt    Heart disease Paternal Uncle    Diabetes Mellitus II Paternal Grandmother    Diabetes type II Paternal Grandfather    Heart disease Paternal Grandfather    Breast cancer Neg Hx    Lung cancer Neg Hx    Cervical cancer Neg Hx     Social History Social History   Tobacco Use   Smoking status: Never    Passive exposure: Current   Smokeless tobacco: Never  Vaping Use   Vaping status: Never Used  Substance Use Topics   Alcohol use: Yes    Comment: occ   Drug use: No     Allergies   Lisinopril    Review of Systems Review of Systems PER HPI  Physical Exam Triage Vital Signs ED Triage Vitals  Encounter Vitals Group     BP 10/03/24 1945 (!) 159/95     Girls Systolic BP Percentile --      Girls Diastolic BP Percentile --      Boys Systolic BP Percentile --      Boys Diastolic BP Percentile --      Pulse Rate 10/03/24 1945 (!) 125     Resp 10/03/24 1945 20     Temp 10/03/24 1945 98.9 F (37.2 C)     Temp Source 10/03/24 1945 Oral     SpO2 10/03/24 1945 98 %     Weight --      Height --      Head Circumference --      Peak Flow --      Pain Score 10/03/24 2023 0     Pain Loc --      Pain Education --      Exclude from Growth Chart --    No data found.  Updated Vital Signs BP (!) 159/95 (BP Location: Right Arm)   Pulse (!) 125   Temp 98.9 F (37.2 C) (Oral)   Resp 20   LMP 09/03/2024 (Approximate) Comment: birth control allows pt to have period every 3 month.  SpO2 98%   Visual Acuity Right Eye Distance:   Left Eye Distance:   Bilateral Distance:    Right Eye Near:   Left Eye Near:    Bilateral Near:     Physical Exam Vitals and nursing note reviewed.  Constitutional:      Appearance: Normal appearance. She is not ill-appearing.  HENT:     Head: Atraumatic.     Mouth/Throat:     Mouth: Mucous membranes are moist.  Eyes:     Extraocular Movements: Extraocular movements intact.     Conjunctiva/sclera:  Conjunctivae normal.     Pupils: Pupils are equal, round, and reactive to light.  Cardiovascular:     Rate and Rhythm: Regular rhythm. Tachycardia present.     Heart sounds: Normal heart sounds.  Pulmonary:     Effort: Pulmonary effort is normal.     Breath sounds: Normal breath sounds.  Musculoskeletal:        General: Normal range of motion.     Cervical back: Normal range of motion and neck supple.  Skin:    General: Skin is warm and dry.  Neurological:     Mental Status: She is alert and oriented to person, place, and time.     Cranial Nerves: No cranial nerve deficit.     Motor: No weakness.     Gait: Gait normal.  Psychiatric:        Mood and Affect: Mood normal.        Thought Content: Thought content normal.        Judgment: Judgment normal.     UC Treatments / Results  Labs (all labs ordered are listed, but only abnormal results are displayed) Labs Reviewed  CBC WITH DIFFERENTIAL/PLATELET - Abnormal; Notable for the following components:      Result Value   RBC 5.60 (*)    Hematocrit 49.4 (*)    MCHC 30.8 (*)    All other components within normal limits   Narrative:    Performed at:  [address], [place], [place]  [id number] Lab Director: Frankey Sas MD, Phone:  [phone]  COMPREHENSIVE METABOLIC PANEL WITH GFR - Abnormal; Notable for the following components:   Glucose 107 (*)    BUN/Creatinine Ratio 8 (*)    CO2 17 (*)    All other components within normal limits   Narrative:    Performed at:  [address] [place], [place]  [id number] Lab Director: Frankey Sas MD, Phone:  [phone]  TSH   Narrative:    Performed at:  [address] [place] [address], [place], [place]  [id number] Lab Director: Frankey Sas MD, Phone:  [phone]    EKG   Radiology No results found.  Procedures Procedures (including critical care time)  Medications Ordered in UC Medications - No data to  display  Initial Impression / Assessment and Plan / UC Course  I have reviewed the triage vital signs and the nursing notes.  Pertinent labs & imaging results that were available during my care of the patient were reviewed by me and considered in my medical decision making (see chart for details).     EKG today showing sinus tachycardia at 108 bpm without acute ST elevations, hypertensive and tachycardic in triage otherwise vital signs within normal limits, she is well-appearing and in no acute distress.  During exam, heart rate is ranging between 95 and 105 bpm, slows to about 86 1, taking deep breaths.  She is otherwise asymptomatic.  Discussed with patient that we will obtain some labs, stop the naltrexone as I suspect this to be a side effect of the medication and to go to the emergency department if symptoms worsen or do not resolve.  Drink plenty fluids, get lots of rest.  PCP follow-up as soon as possible.   Final Clinical Impressions(s) / UC Diagnoses   Final diagnoses:  Palpitations     Discharge Instructions      Discontinue the naltrexone in case this is what is causing your elevated heart rate.  Rest, stay well-hydrated and call your primary care provider first thing tomorrow morning.  We have sent out some labs and we will let you know if anything comes back abnormal when we get these back.  Go to the emergency department if experiencing any new or worsening symptoms    ED Prescriptions   None    PDMP not reviewed this encounter.   Stuart Vernell Norris, PA-C 10/06/24 1133

## 2024-10-12 ENCOUNTER — Other Ambulatory Visit: Payer: Self-pay | Admitting: Family Medicine

## 2024-10-12 DIAGNOSIS — J3089 Other allergic rhinitis: Secondary | ICD-10-CM

## 2024-10-13 ENCOUNTER — Telehealth: Payer: Self-pay | Admitting: Family Medicine

## 2024-10-13 NOTE — Telephone Encounter (Signed)
 Copied from CRM [id number]. Topic: Clinical - Prescription Issue >> Oct 13, 2024 11:51 AM Terri MATSU wrote: Reason for CRM: Patient wants to know why her allergy  medication Levocetirizine Dihydrochloride  5 MG was discontinued. Callback number [phone]

## 2024-10-16 NOTE — Telephone Encounter (Signed)
 It was not discontinued, it was refilled

## 2024-11-10 ENCOUNTER — Ambulatory Visit: Admitting: Internal Medicine

## 2024-11-24 ENCOUNTER — Ambulatory Visit: Admitting: Internal Medicine

## 2025-01-12 ENCOUNTER — Telehealth (INDEPENDENT_AMBULATORY_CARE_PROVIDER_SITE_OTHER): Payer: Self-pay | Admitting: Family Medicine

## 2025-01-12 ENCOUNTER — Encounter: Payer: Self-pay | Admitting: Family Medicine

## 2025-01-12 DIAGNOSIS — E7849 Other hyperlipidemia: Secondary | ICD-10-CM

## 2025-01-12 DIAGNOSIS — E559 Vitamin D deficiency, unspecified: Secondary | ICD-10-CM

## 2025-01-12 DIAGNOSIS — J3089 Other allergic rhinitis: Secondary | ICD-10-CM

## 2025-01-12 DIAGNOSIS — R7301 Impaired fasting glucose: Secondary | ICD-10-CM

## 2025-01-12 DIAGNOSIS — E038 Other specified hypothyroidism: Secondary | ICD-10-CM

## 2025-01-12 MED ORDER — LEVOCETIRIZINE DIHYDROCHLORIDE 5 MG PO TABS: ORAL_TABLET | ORAL | 1 refills | Status: AC

## 2025-01-12 NOTE — Progress Notes (Signed)
 "  Virtual Visit via Video Note  I connected with Alisha Brock on 01/14/25 at  9:20 AM EST by a video enabled telemedicine application and verified that I am speaking with the correct person using two identifiers.  Patient Location: Home Provider Location: Home Office  I discussed the limitations, risks, security, and privacy concerns of performing an evaluation and management service by video and the availability of in person appointments. I also discussed with the patient that there may be a patient responsible charge related to this service. The patient expressed understanding and agreed to proceed.  Subjective: PCP: Edman Meade PEDLAR, FNP  Chief Complaint  Patient presents with   Medical Management of Chronic Issues    6 month follow up   HPI The patient presents today for management of chronic conditions and reports no complaints or concerns at this time. She reports adherence to her treatment regimen with no side effects or adverse effects from her medications.   ROS: Per HPI Current Medications[1]  Observations/Objective: There were no vitals filed for this visit. Physical Exam Patient is well-developed, well-nourished in no acute distress.  Resting comfortably at home.  Head is normocephalic, atraumatic.  No labored breathing.  Speech is clear and coherent with logical content.  Patient is alert and oriented at baseline.   Assessment and Plan: Environmental and seasonal allergies Assessment & Plan: Refills sent to the pharmacy  Orders: -     Levocetirizine Dihydrochloride ; TAKE 1 TABLET(5 MG) BY MOUTH EVERY EVENING  Dispense: 90 tablet; Refill: 1  IFG (impaired fasting glucose) -     Hemoglobin A1c  Vitamin D  deficiency -     VITAMIN D  25 Hydroxy (Vit-D Deficiency, Fractures)  TSH (thyroid -stimulating hormone deficiency) -     TSH + free T4  Other hyperlipidemia -     Lipid panel -     CMP14+EGFR -     CBC with  Differential/Platelet   Encouraged the patient to continue her treatment regimen as prescribed. A heart-healthy diet was encouraged, along with increased physical activity as tolerated.   Follow Up Instructions: No follow-ups on file.   I discussed the assessment and treatment plan with the patient. The patient was provided an opportunity to ask questions, and all were answered. The patient agreed with the plan and demonstrated an understanding of the instructions.   The patient was advised to call back or seek an in-person evaluation if the symptoms worsen or if the condition fails to improve as anticipated.  The above assessment and management plan was discussed with the patient. The patient verbalized understanding of and has agreed to the management plan.   Meade PEDLAR Edman, FNP     [1]  Current Outpatient Medications:    buPROPion ER (WELLBUTRIN SR) 100 MG 12 hr tablet, Take by mouth., Disp: , Rfl:    cholecalciferol (VITAMIN D3) 25 MCG (1000 UNIT) tablet, Take 1,000 Units by mouth daily., Disp: , Rfl:    Cyanocobalamin  (VITAMIN B 12 PO), Take 1 tablet by mouth daily., Disp: , Rfl:    Fe Bisgly-Vit C-Vit B12-FA (GENTLE IRON PO), Take 1 tablet by mouth daily., Disp: , Rfl:    fluticasone  (FLONASE ) 50 MCG/ACT nasal spray, Place 2 sprays into both nostrils daily., Disp: 16 g, Rfl: 5   ipratropium (ATROVENT ) 0.06 % nasal spray, Place 2 sprays into both nostrils 4 (four) times daily as needed., Disp: 15 mL, Rfl: 12   JOLESSA 0.15-0.03 MG tablet, Take 1 tablet by mouth  daily., Disp: , Rfl:    ketoconazole (NIZORAL) 2 % cream, Apply 1 Application topically 2 (two) times daily., Disp: , Rfl:    magnesium gluconate (MAGONATE) 500 (27 Mg) MG TABS tablet, Take 120 mg by mouth in the morning and at bedtime., Disp: , Rfl:    Olopatadine  HCl 0.2 % SOLN, Apply 1 drop to eye daily as needed (itchy watery eyes)., Disp: 2.5 mL, Rfl: 5   levocetirizine (XYZAL ) 5 MG tablet, TAKE 1 TABLET(5 MG) BY  MOUTH EVERY EVENING, Disp: 90 tablet, Rfl: 1   naltrexone (DEPADE) 50 MG tablet, Take 50 mg by mouth daily. (Patient not taking: Reported on 01/12/2025), Disp: , Rfl:   "

## 2025-01-14 NOTE — Assessment & Plan Note (Signed)
 Refills sent to the pharmacy.

## 2025-01-26 LAB — CBC WITH DIFFERENTIAL/PLATELET
Basophils Absolute: 0.1 10*3/uL (ref 0.0–0.2)
Basos: 1 %
EOS (ABSOLUTE): 0.1 10*3/uL (ref 0.0–0.4)
Eos: 1 %
Hematocrit: 42.4 % (ref 34.0–46.6)
Hemoglobin: 13.7 g/dL (ref 11.1–15.9)
Immature Grans (Abs): 0 10*3/uL (ref 0.0–0.1)
Immature Granulocytes: 0 %
Lymphocytes Absolute: 3.2 10*3/uL — ABNORMAL HIGH (ref 0.7–3.1)
Lymphs: 48 %
MCH: 27.2 pg (ref 26.6–33.0)
MCHC: 32.3 g/dL (ref 31.5–35.7)
MCV: 84 fL (ref 79–97)
Monocytes Absolute: 0.6 10*3/uL (ref 0.1–0.9)
Monocytes: 9 %
Neutrophils Absolute: 2.7 10*3/uL (ref 1.4–7.0)
Neutrophils: 41 %
Platelets: 397 10*3/uL (ref 150–450)
RBC: 5.04 x10E6/uL (ref 3.77–5.28)
RDW: 13 % (ref 11.7–15.4)
WBC: 6.7 10*3/uL (ref 3.4–10.8)

## 2025-01-26 LAB — CMP14+EGFR
ALT: 22 [IU]/L (ref 0–32)
AST: 13 [IU]/L (ref 0–40)
Albumin: 3.9 g/dL (ref 3.9–4.9)
Alkaline Phosphatase: 69 [IU]/L (ref 41–116)
BUN/Creatinine Ratio: 11 (ref 9–23)
BUN: 9 mg/dL (ref 6–24)
Bilirubin Total: 0.4 mg/dL (ref 0.0–1.2)
CO2: 21 mmol/L (ref 20–29)
Calcium: 9 mg/dL (ref 8.7–10.2)
Chloride: 103 mmol/L (ref 96–106)
Creatinine, Ser: 0.84 mg/dL (ref 0.57–1.00)
Globulin, Total: 2.4 g/dL (ref 1.5–4.5)
Glucose: 82 mg/dL (ref 70–99)
Potassium: 4.1 mmol/L (ref 3.5–5.2)
Sodium: 139 mmol/L (ref 134–144)
Total Protein: 6.3 g/dL (ref 6.0–8.5)
eGFR: 86 mL/min/{1.73_m2}

## 2025-01-26 LAB — LIPID PANEL
Chol/HDL Ratio: 3.5 ratio (ref 0.0–4.4)
Cholesterol, Total: 182 mg/dL (ref 100–199)
HDL: 52 mg/dL
LDL Chol Calc (NIH): 114 mg/dL — ABNORMAL HIGH (ref 0–99)
Triglycerides: 87 mg/dL (ref 0–149)
VLDL Cholesterol Cal: 16 mg/dL (ref 5–40)

## 2025-01-26 LAB — HEMOGLOBIN A1C
Est. average glucose Bld gHb Est-mCnc: 120 mg/dL
Hgb A1c MFr Bld: 5.8 % — ABNORMAL HIGH (ref 4.8–5.6)

## 2025-01-26 LAB — TSH+FREE T4
Free T4: 1.16 ng/dL (ref 0.82–1.77)
TSH: 1.71 u[IU]/mL (ref 0.450–4.500)

## 2025-01-26 LAB — VITAMIN D 25 HYDROXY (VIT D DEFICIENCY, FRACTURES): Vit D, 25-Hydroxy: 65.5 ng/mL (ref 30.0–100.0)
